# Patient Record
Sex: Female | Born: 1995
Health system: Southern US, Community
[De-identification: ages and names within clinical notes are randomized; demographics above are authoritative.]

## PROBLEM LIST (undated history)

## (undated) ENCOUNTER — Inpatient Hospital Stay: Payer: Self-pay

## (undated) DIAGNOSIS — F419 Anxiety disorder, unspecified: Secondary | ICD-10-CM

## (undated) DIAGNOSIS — R112 Nausea with vomiting, unspecified: Secondary | ICD-10-CM

## (undated) DIAGNOSIS — O1490 Unspecified pre-eclampsia, unspecified trimester: Secondary | ICD-10-CM

## (undated) DIAGNOSIS — Z9889 Other specified postprocedural states: Secondary | ICD-10-CM

## (undated) DIAGNOSIS — F32A Depression, unspecified: Secondary | ICD-10-CM

## (undated) DIAGNOSIS — F4310 Post-traumatic stress disorder, unspecified: Secondary | ICD-10-CM

## (undated) HISTORY — DX: Anxiety disorder, unspecified: F41.9

## (undated) HISTORY — PX: WISDOM TOOTH EXTRACTION: SHX21

## (undated) HISTORY — DX: Post-traumatic stress disorder, unspecified: F43.10

## (undated) HISTORY — PX: CYST EXCISION: SHX5701

## (undated) HISTORY — DX: Depression, unspecified: F32.A

## (undated) HISTORY — DX: Unspecified pre-eclampsia, unspecified trimester: O14.90

---
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
## 2015-03-08 ENCOUNTER — Ambulatory Visit: Payer: Self-pay | Admitting: Psychiatry

## 2015-11-29 LAB — OB RESULTS CONSOLE GC/CHLAMYDIA
Chlamydia: NEGATIVE
Gonorrhea: NEGATIVE

## 2015-12-20 LAB — OB RESULTS CONSOLE ANTIBODY SCREEN: Antibody Screen: NEGATIVE

## 2015-12-20 LAB — OB RESULTS CONSOLE ABO/RH: RH TYPE: POSITIVE

## 2015-12-20 LAB — OB RESULTS CONSOLE HEPATITIS B SURFACE ANTIGEN: Hepatitis B Surface Ag: NEGATIVE

## 2015-12-20 LAB — OB RESULTS CONSOLE RPR: RPR: NONREACTIVE

## 2015-12-20 LAB — OB RESULTS CONSOLE VARICELLA ZOSTER ANTIBODY, IGG: VARICELLA IGG: IMMUNE

## 2015-12-20 LAB — OB RESULTS CONSOLE HIV ANTIBODY (ROUTINE TESTING): HIV: NONREACTIVE

## 2015-12-20 LAB — OB RESULTS CONSOLE RUBELLA ANTIBODY, IGM: RUBELLA: IMMUNE

## 2016-04-19 ENCOUNTER — Inpatient Hospital Stay
Admission: EM | Admit: 2016-04-19 | Discharge: 2016-04-19 | Disposition: A | Discharge status: Home / Self Care | Payer: Medicaid Other | Attending: Obstetrics and Gynecology | Admitting: Obstetrics and Gynecology

## 2016-04-19 DIAGNOSIS — Z3A26 26 weeks gestation of pregnancy: Secondary | ICD-10-CM | POA: Insufficient documentation

## 2016-04-19 DIAGNOSIS — Z880 Allergy status to penicillin: Secondary | ICD-10-CM | POA: Diagnosis not present

## 2016-04-19 DIAGNOSIS — M549 Dorsalgia, unspecified: Secondary | ICD-10-CM | POA: Insufficient documentation

## 2016-04-19 DIAGNOSIS — O26892 Other specified pregnancy related conditions, second trimester: Secondary | ICD-10-CM | POA: Diagnosis present

## 2016-04-19 DIAGNOSIS — O2302 Infections of kidney in pregnancy, second trimester: Secondary | ICD-10-CM | POA: Diagnosis not present

## 2016-04-19 MED ORDER — HYDROCODONE-ACETAMINOPHEN 5-325 MG PO TABS: 1.0000 | ORAL_TABLET | Freq: Four times a day (QID) | ORAL | 0 refills | Status: DC | PRN

## 2016-04-19 NOTE — Final Progress Note (Signed)
Physician Final Progress Note  Patient ID: Shelby KarvonenLauren A Long MRN: [id number] DOB/AGE: June 07, 1996 20 y.o.  Admit date: 04/19/2016 Admitting provider: Conard NovakStephen D Mouhamadou Gittleman, MD Discharge date: 04/19/2016   Admission Diagnoses: back pain, pyelonephritis  Discharge Diagnoses:  Pyelonephritis, back pain  History of Present Illness: The patient is a 20 y.o. female G1P0 at 5839w2d recently diagnosed with pyelonephritis in the clinic and started on bactrim.  She started yesterday.  Notes worsening back pain on the left side.  Sitting up and standing for long periods make it worse.  A heating pad makes it better.  The pain is dull and moderate to severe.  No associated symptoms. Specifically denies fever and chills.  No history of UTI this pregnancy.  Notes +FM, no LOF, no vaginal bleeding. No ctx.   History reviewed. No pertinent past medical history.  History reviewed. No pertinent surgical history.  No current facility-administered medications on file prior to encounter.    No current outpatient prescriptions on file prior to encounter.    Allergies  Allergen Reactions  . Penicillins Swelling    Social History   Social History  . Marital status: Single    Spouse name: N/A  . Number of children: N/A  . Years of education: N/A   Occupational History  . Not on file.   Social History Main Topics  . Smoking status: Never Smoker  . Smokeless tobacco: Never Used  . Alcohol use Not on file  . Drug use: Unknown  . Sexual activity: Not on file   Other Topics Concern  . Not on file   Social History Narrative  . No narrative on file    Physical Exam: BP 109/66 (BP Location: Left Arm)   Pulse 84   Temp 98.8 F (37.1 C) (Oral)   Resp 19   Ht 5\' 7"  (1.702 m)   Wt 181 lb (82.1 kg)   BMI 28.35 kg/m   Gen: NAD CV: RRR Pulm: CTAB Abd: gravid, NT, back with diffuse left back pain, not localized to CVA but in the general vicinity.   Pelvic: deferred Ext: no e/c/t  Consults:  None  Significant Findings/ Diagnostic Studies: None  Procedures: NST Baseline: 140 Variability: moderate Accelerations: present (10 x 10) Decelerations: absent Tocometry: absent ctx   Discharge Condition: stable  Disposition: Final discharge disposition not confirmed  Diet: Regular diet  Discharge Activity: Activity as tolerated     Medication List    TAKE these medications   HYDROcodone-acetaminophen 5-325 MG tablet Commonly known as:  NORCO Take 1 tablet by mouth every 6 (six) hours as needed for moderate pain.   prenatal multivitamin Tabs tablet Take 1 tablet by mouth daily at 12 noon.   sulfamethoxazole-trimethoprim 800-160 MG tablet Commonly known as:  BACTRIM DS,SEPTRA DS Take 1 tablet by mouth 2 (two) times daily.        Total time spent taking care of this patient: 25 minutes  Signed: Conard NovakJackson, Shawnay Bramel D, MD  04/19/2016, 10:23 PM

## 2016-04-19 NOTE — Discharge Summary (Signed)
Patient discharged with instructions on pain medication, labor precautions, abdominal pain, and when to seek medical attention. Patient ambulatory at discharge with steady gait and no complaints. Patient discharged with husband.

## 2016-04-19 NOTE — OB Triage Note (Signed)
Patient came in for observation for lower abdominal and back pain since this week. Patient started taking her antibiotics for current UTI yesterday and the pain is getting worse she states. Patient denies uterine contractions. Patient states she has been feeling the baby move fine. Patient denies leaking of fluid, vaginal bleeding and spotting. Vital signs stable and patient afebrile. FHR baseline 135 moderate variability 15 x 15 accelerations and no decelerations. Significant other at bedside. Will continue to monitor.

## 2016-05-28 ENCOUNTER — Inpatient Hospital Stay
Admission: EM | Admit: 2016-05-28 | Discharge: 2016-05-28 | Disposition: A | Discharge status: Home / Self Care | Payer: Medicaid Other | Attending: Certified Nurse Midwife | Admitting: Certified Nurse Midwife

## 2016-05-28 ENCOUNTER — Encounter: Payer: Self-pay | Admitting: *Deleted

## 2016-05-28 DIAGNOSIS — O133 Gestational [pregnancy-induced] hypertension without significant proteinuria, third trimester: Secondary | ICD-10-CM | POA: Insufficient documentation

## 2016-05-28 DIAGNOSIS — O99113 Other diseases of the blood and blood-forming organs and certain disorders involving the immune mechanism complicating pregnancy, third trimester: Secondary | ICD-10-CM | POA: Diagnosis not present

## 2016-05-28 DIAGNOSIS — Z3A31 31 weeks gestation of pregnancy: Secondary | ICD-10-CM | POA: Diagnosis not present

## 2016-05-28 HISTORY — DX: Nausea with vomiting, unspecified: R11.2

## 2016-05-28 HISTORY — DX: Other specified postprocedural states: Z98.890

## 2016-05-28 LAB — PROTEIN / CREATININE RATIO, URINE
Creatinine, Urine: 142 mg/dL
Protein Creatinine Ratio: 0.08 mg/mg{Cre} (ref 0.00–0.15)
TOTAL PROTEIN, URINE: 11 mg/dL

## 2016-05-28 LAB — COMPREHENSIVE METABOLIC PANEL
ALBUMIN: 3.2 g/dL — AB (ref 3.5–5.0)
ALT: 25 U/L (ref 14–54)
ANION GAP: 5 (ref 5–15)
AST: 28 U/L (ref 15–41)
Alkaline Phosphatase: 90 U/L (ref 38–126)
BUN: 7 mg/dL (ref 6–20)
CHLORIDE: 109 mmol/L (ref 101–111)
CO2: 23 mmol/L (ref 22–32)
Calcium: 8.9 mg/dL (ref 8.9–10.3)
Creatinine, Ser: 0.54 mg/dL (ref 0.44–1.00)
GFR calc Af Amer: >60 mL/min (ref >60)
GFR calc non Af Amer: >60 mL/min (ref >60)
GLUCOSE: 71 mg/dL (ref 65–99)
Potassium: 3.6 mmol/L (ref 3.5–5.1)
SODIUM: 137 mmol/L (ref 135–145)
Total Bilirubin: <0.1 mg/dL — ABNORMAL LOW (ref 0.3–1.2)
Total Protein: 6.3 g/dL — ABNORMAL LOW (ref 6.5–8.1)

## 2016-05-28 LAB — CBC
HEMATOCRIT: 35.2 % (ref 35.0–47.0)
HEMOGLOBIN: 12.1 g/dL (ref 12.0–16.0)
MCH: 32.2 pg (ref 26.0–34.0)
MCHC: 34.3 g/dL (ref 32.0–36.0)
MCV: 93.9 fL (ref 80.0–100.0)
Platelets: 148 10*3/uL — ABNORMAL LOW (ref 150–440)
RBC: 3.75 MIL/uL — ABNORMAL LOW (ref 3.80–5.20)
RDW: 13.6 % (ref 11.5–14.5)
WBC: 8 10*3/uL (ref 3.6–11.0)

## 2016-05-28 MED ORDER — ACETAMINOPHEN 500 MG PO TABS: 1000.0000 mg | ORAL_TABLET | Freq: Four times a day (QID) | ORAL | Status: DC | PRN

## 2016-05-28 NOTE — OB Triage Note (Signed)
Redvd from office visit with c/o weight gain, proteinuria, and elevated b/p.  Changed to gown and to bed.  EFM applied.  Plan of care discussed and oriented to room.

## 2016-05-28 NOTE — Final Progress Note (Signed)
Physician Final Progress Note  Patient ID: Shelby KarvonenLauren A Nininger MRN: [id number] DOB/AGE: 19-Apr-1996 20 y.o.  Admit date: 05/28/2016 Admitting provider: Conard NovakStephen D Jackson, MD Discharge date: 05/28/2016   Admission Diagnoses: elevated blood pressure and proteinuria in third trimester  Discharge Diagnoses:  IUP at 31weeks 6 days Mild thrombocytopenia  No evidence of pre-eclampsia  Consults: None  Significant Findings/ Diagnostic Studies: 20 year old G1P0, EDC 07/24/16, by 9 week ultrasound.  Presents from office for PIH eval.  BP in office 142/88, 148/94, with +2 proteinuria.  Complaints of headaches and increased swelling.  16 pound weight gain in last 3-4 weeks. Had frontal headache on arrival, denies visual changes, SOB, CP, and RUQ.  Baby active.    Temp:  [98.9 F (37.2 C)] 98.9 F (37.2 C) (10/31 1533) Pulse Rate:  [67-85] 67 (10/31 1654) Resp:  [16] 16 (10/31 1533) BP: (124-132)/(70-85) 128/71 (10/31 1654) Weight:  [93 kg (205 lb)] 93 kg (205 lb) (10/31 1533)  Exam:  Neuro: +1/+2 DTR, alert, awake, answers questions appropriately Lungs: CTA BL Heart: Regular rate and rhythm, without murmur Extremities: +1/+2 non-pitting edema bilateral lower extremities, hand edema present Abdomen: Gravid, soft, non-tender, negative CVA tenderness FHR: Baseline 135-140, moderate variability, accelerations to 170's, no decelerations Toco: acontractile  Ultrasound: Cephalic presentation  Results for orders placed or performed during the hospital encounter of 05/28/16 (from the past 24 hour(s))  Protein / creatinine ratio, urine     Status: None   Collection Time: 05/28/16  3:24 PM  Result Value Ref Range   Creatinine, Urine 142 mg/dL   Total Protein, Urine 11 mg/dL   Protein Creatinine Ratio 0.08 0.00 - 0.15 mg/mg[Cre]  Comprehensive metabolic panel     Status: Abnormal   Collection Time: 05/28/16  4:00 PM  Result Value Ref Range   Sodium 137 135 - 145 mmol/L   Potassium 3.6 3.5 - 5.1  mmol/L   Chloride 109 101 - 111 mmol/L   CO2 23 22 - 32 mmol/L   Glucose, Bld 71 65 - 99 mg/dL   BUN 7 6 - 20 mg/dL   Creatinine, Ser 4.090.54 0.44 - 1.00 mg/dL   Calcium 8.9 8.9 - 81.110.3 mg/dL   Total Protein 6.3 (L) 6.5 - 8.1 g/dL   Albumin 3.2 (L) 3.5 - 5.0 g/dL   AST 28 15 - 41 U/L   ALT 25 14 - 54 U/L   Alkaline Phosphatase 90 38 - 126 U/L   Total Bilirubin <0.1 (L) 0.3 - 1.2 mg/dL   GFR calc non Af Amer >60 >60 mL/min   GFR calc Af Amer >60 >60 mL/min   Anion gap 5 5 - 15  CBC     Status: Abnormal   Collection Time: 05/28/16  4:00 PM  Result Value Ref Range   WBC 8.0 3.6 - 11.0 K/uL   RBC 3.75 (L) 3.80 - 5.20 MIL/uL   Hemoglobin 12.1 12.0 - 16.0 g/dL   HCT 91.435.2 78.235.0 - 95.647.0 %   MCV 93.9 80.0 - 100.0 fL   MCH 32.2 26.0 - 34.0 pg   MCHC 34.3 32.0 - 36.0 g/dL   RDW 21.313.6 08.611.5 - 57.814.5 %   Platelets 148 (L) 150 - 440 K/uL   A: IUP at 31 6/7 weeks, mild thrombocytopenia, no evidence of pre-eclampsia P: Consulted with Dr. Jean RosenthalJackson re: plan of management.  Will discharge today with pre-eclampsia precautions.  Follow-up Friday 11/3 at 1000.  Procedures: None  Discharge Condition: stable  Disposition: 01-Home or  Self Care  Diet: Regular diet  Discharge Activity: Activity as tolerated     Medication List    TAKE these medications   nitrofurantoin (macrocrystal-monohydrate) 100 MG capsule Commonly known as:  MACROBID Take 100 mg by mouth at bedtime.   prenatal multivitamin Tabs tablet Take 1 tablet by mouth daily at 12 noon.        Total time spent taking care of this patient: 20 minutes  Signed: Farrel ConnersGUTIERREZ, Carisha Kantor 05/28/2016, 5:46 PM

## 2016-05-28 NOTE — Progress Notes (Signed)
Discharge instructions given and explained.  Verbalized understanding.  Questions answered.  Signed copy on chart.

## 2016-05-30 ENCOUNTER — Other Ambulatory Visit: Payer: Self-pay | Admitting: Obstetrics and Gynecology

## 2016-05-30 ENCOUNTER — Ambulatory Visit (INDEPENDENT_AMBULATORY_CARE_PROVIDER_SITE_OTHER): Payer: 59 | Admitting: Obstetrics and Gynecology

## 2016-05-30 VITALS — BP 119/71 | HR 89 | Wt 206.8 lb

## 2016-05-30 DIAGNOSIS — O1203 Gestational edema, third trimester: Secondary | ICD-10-CM

## 2016-05-30 DIAGNOSIS — F419 Anxiety disorder, unspecified: Secondary | ICD-10-CM

## 2016-05-30 DIAGNOSIS — Z8659 Personal history of other mental and behavioral disorders: Secondary | ICD-10-CM

## 2016-05-30 DIAGNOSIS — Z3403 Encounter for supervision of normal first pregnancy, third trimester: Secondary | ICD-10-CM

## 2016-05-30 DIAGNOSIS — O2301 Infections of kidney in pregnancy, first trimester: Secondary | ICD-10-CM

## 2016-05-30 DIAGNOSIS — O26843 Uterine size-date discrepancy, third trimester: Secondary | ICD-10-CM

## 2016-05-30 LAB — POCT URINALYSIS DIPSTICK
Bilirubin, UA: NEGATIVE
Glucose, UA: NEGATIVE
KETONES UA: NEGATIVE
Leukocytes, UA: NEGATIVE
Nitrite, UA: NEGATIVE
PH UA: 6
RBC UA: NEGATIVE
SPEC GRAV UA: 1.025
UROBILINOGEN UA: NEGATIVE

## 2016-05-30 NOTE — Patient Instructions (Addendum)
Preeclampsia and Eclampsia Preeclampsia is a serious condition that develops only during pregnancy. It is also called toxemia of pregnancy. This condition causes high blood pressure along with other symptoms, such as swelling and headaches. These may develop as the condition gets worse. Preeclampsia may occur 20 weeks or later into your pregnancy.  Diagnosing and treating preeclampsia early is very important. If not treated early, it can cause serious problems for you and your baby. One problem it can lead to is eclampsia, which is a condition that causes muscle jerking or shaking (convulsions) in the mother. Delivering your baby is the best treatment for preeclampsia or eclampsia.  RISK FACTORS The cause of preeclampsia is not known. You may be more likely to develop preeclampsia if you have certain risk factors. These include:   Being pregnant for the first time.  Having preeclampsia in a past pregnancy.  Having a family history of preeclampsia.  Having high blood pressure.  Being pregnant with twins or triplets.  Being 135 or older.  Being African American.  Having kidney disease or diabetes.  Having medical conditions such as lupus or blood diseases.  Being very overweight (obese). SIGNS AND SYMPTOMS  The earliest signs of preeclampsia are:  High blood pressure.  Increased protein in your urine. Your health care provider will check for this at every prenatal visit. Other symptoms that can develop include:   Severe headaches.  Sudden weight gain.  Swelling of your hands, face, legs, and feet.  Feeling sick to your stomach (nauseous) and throwing up (vomiting).  Vision problems (blurred or double vision).  Numbness in your face, arms, legs, and feet.  Dizziness.  Slurred speech.  Sensitivity to bright lights.  Abdominal pain. DIAGNOSIS  There are no screening tests for preeclampsia. Your health care provider will ask you about symptoms and check for signs of  preeclampsia during your prenatal visits. You may also have tests, including:  Urine testing.  Blood testing.  Checking your baby's heart rate.  Checking the health of your baby and your placenta using images created with sound waves (ultrasound). TREATMENT  You can work out the best treatment approach together with your health care provider. It is very important to keep all prenatal appointments. If you have an increased risk of preeclampsia, you may need more frequent prenatal exams.  Your health care provider may prescribe bed rest.  You may have to eat as little salt as possible.  You may need to take medicine to lower your blood pressure if the condition does not respond to more conservative measures.  You may need to stay in the hospital if your condition is severe. There, treatment will focus on controlling your blood pressure and fluid retention. You may also need to take medicine to prevent seizures.  If the condition gets worse, your baby may need to be delivered early to protect you and the baby. You may have your labor started with medicine (be induced), or you may have a cesarean delivery.  Preeclampsia usually goes away after the baby is born. HOME CARE INSTRUCTIONS   Only take over-the-counter or prescription medicines as directed by your health care provider.  Lie on your left side while resting. This keeps pressure off your baby.  Elevate your feet while resting.  Get regular exercise. Ask your health care provider what type of exercise is safe for you.  Avoid caffeine and alcohol.  Do not smoke.  Drink 6-8 glasses of water every day.  Eat a balanced diet  that is low in salt. Do not add salt to your food.  Avoid stressful situations as much as possible.  Get plenty of rest and sleep.  Keep all prenatal appointments and tests as scheduled. SEEK MEDICAL CARE IF:  You are gaining more weight than expected.  You have any headaches, abdominal pain, or  nausea.  You are bruising more than usual.  You feel dizzy or light-headed. SEEK IMMEDIATE MEDICAL CARE IF:   You develop sudden or severe swelling anywhere in your body. This usually happens in the legs.  You gain 5 lb (2.3 kg) or more in a week.  You have a severe headache, dizziness, problems with your vision, or confusion.  You have severe abdominal pain.  You have lasting nausea or vomiting.  You have a seizure.  You have trouble moving any part of your body.  You develop numbness in your body.  You have trouble speaking.  You have any abnormal bleeding.  You develop a stiff neck.  You pass out. MAKE SURE YOU:   Understand these instructions.  Will watch your condition.  Will get help right away if you are not doing well or get worse.   This information is not intended to replace advice given to you by your health care provider. Make sure you discuss any questions you have with your health care provider.   Document Released: 07/12/2000 Document Revised: 07/20/2013 Document Reviewed: 05/07/2013 Elsevier Interactive Patient Education 2016 Elsevier Inc. Edema Edema is an abnormal buildup of fluids in your bodytissues. Edema is somewhatdependent on gravity to pull the fluid to the lowest place in your body. That makes the condition more common in the legs and thighs (lower extremities). Painless swelling of the feet and ankles is common and becomes more likely as you get older. It is also common in looser tissues, like around your eyes.  When the affected area is squeezed, the fluid may move out of that spot and leave a dent for a few moments. This dent is called pitting.  CAUSES  There are many possible causes of edema. Eating too much salt and being on your feet or sitting for a long time can cause edema in your legs and ankles. Hot weather may make edema worse. Common medical causes of edema include:  Heart failure.  Liver disease.  Kidney disease.  Weak  blood vessels in your legs.  Cancer.  An injury.  Pregnancy.  Some medications.  Obesity. SYMPTOMS  Edema is usually painless.Your skin may look swollen or shiny.  DIAGNOSIS  Your health care provider may be able to diagnose edema by asking about your medical history and doing a physical exam. You may need to have tests such as X-rays, an electrocardiogram, or blood tests to check for medical conditions that may cause edema.  TREATMENT  Edema treatment depends on the cause. If you have heart, liver, or kidney disease, you need the treatment appropriate for these conditions. General treatment may include:  Elevation of the affected body part above the level of your heart.  Compression of the affected body part. Pressure from elastic bandages or support stockings squeezes the tissues and forces fluid back into the blood vessels. This keeps fluid from entering the tissues.  Restriction of fluid and salt intake.  Use of a water pill (diuretic). These medications are appropriate only for some types of edema. They pull fluid out of your body and make you urinate more often. This gets rid of fluid and reduces swelling,  but diuretics can have side effects. Only use diuretics as directed by your health care provider. HOME CARE INSTRUCTIONS   Keep the affected body part above the level of your heart when you are lying down.   Do not sit still or stand for prolonged periods.   Do not put anything directly under your knees when lying down.  Do not wear constricting clothing or garters on your upper legs.   Exercise your legs to work the fluid back into your blood vessels. This may help the swelling go down.   Wear elastic bandages or support stockings to reduce ankle swelling as directed by your health care provider.   Eat a low-salt diet to reduce fluid if your health care provider recommends it.   Only take medicines as directed by your health care provider. SEEK MEDICAL CARE  IF:   Your edema is not responding to treatment.  You have heart, liver, or kidney disease and notice symptoms of edema.  You have edema in your legs that does not improve after elevating them.   You have sudden and unexplained weight gain. SEEK IMMEDIATE MEDICAL CARE IF:   You develop shortness of breath or chest pain.   You cannot breathe when you lie down.  You develop pain, redness, or warmth in the swollen areas.   You have heart, liver, or kidney disease and suddenly get edema.  You have a fever and your symptoms suddenly get worse. MAKE SURE YOU:   Understand these instructions.  Will watch your condition.  Will get help right away if you are not doing well or get worse.   This information is not intended to replace advice given to you by your health care provider. Make sure you discuss any questions you have with your health care provider.   Document Released: 07/15/2005 Document Revised: 08/05/2014 Document Reviewed: 05/07/2013 Elsevier Interactive Patient Education [employer]2016 Elsevier Inc.

## 2016-05-30 NOTE — Progress Notes (Signed)
TRANSFER IN OB HISTORY AND PHYSICAL  SUBJECTIVE:       Shelby Long is a 20 y.o. G1P0 female,  Estimated Date of Delivery: 07/24/16, 520w1d, by LMP 10/18/15 (approximate) who presents today for Transition of Prenatal Care.  [facility] as she was unhappy with her care.  EPIC data migration from outside records is accomplished today.  Complaints today include: bilateral lower extremity swelling and rapid weight gain.  Patient notes that by the evening her legs and feet have swollen to 2-3 times their normal size.  Also notes that ~ 2-3 weeks ago, she had a sudden 20 lb weight gain within 1 week.  Patient denied any changes in her diet.    Patient Active Problem List   Diagnosis Date Noted  . Anxiety 01/31/2015  . Depression 01/31/2015  . Leg swelling in pregnancy in third trimester 05/30/2016  . Pyelonephritis affecting pregnancy in first trimester 05/30/2016      Gynecologic History Reports normal menses.  LMP 10/18/2015.  Contraception: none Last Pap: performed 12/2015. Results were: normal  Obstetric History OB History  Gravida Para Term Preterm AB Living  1            SAB TAB Ectopic Multiple Live Births               # Outcome Date GA Lbr Len/2nd Weight Sex Delivery Anes PTL Lv  1 Current               Past Medical History:  Diagnosis Date  . Medical history non-contributory   . PONV (postoperative nausea and vomiting)     Past Surgical History:  Procedure Laterality Date  . WISDOM TOOTH EXTRACTION  2015    No family history on file.   Social History   Social History  . Marital status: Single    Spouse name: N/A  . Number of children: N/A  . Years of education: N/A   Occupational History  . Not on file.   Social History Main Topics  . Smoking status: Never Smoker  . Smokeless tobacco: Never Used  . Alcohol use No  . Drug use: No  . Sexual activity: Yes   Other Topics Concern  . Not on file   Social  History Narrative  . No narrative on file    Current Outpatient Prescriptions on File Prior to Visit  Medication Sig Dispense Refill  . nitrofurantoin, macrocrystal-monohydrate, (MACROBID) 100 MG capsule Take 100 mg by mouth at bedtime.    . Prenatal Vit-Fe Fumarate-FA (PRENATAL MULTIVITAMIN) TABS tablet Take 1 tablet by mouth daily at 12 noon.     No current facility-administered medications on file prior to visit.     Allergies  Allergen Reactions  . Sulfa Antibiotics Anaphylaxis  . Penicillins Swelling    The following portions of the patient's history were reviewed and updated as appropriate: allergies, current medications, past OB history, past medical history, past surgical history, past family history, past social history, and problem list.    OBJECTIVE: Initial Physical Exam (New OB)  GENERAL APPEARANCE: alert, well appearing HEAD: normocephalic, atraumatic MOUTH: mucous membranes moist, pharynx normal without lesions THYROID: no thyromegaly or masses present BREASTS: no masses noted, no significant tenderness, no palpable axillary nodes, no skin changes LUNGS: clear to auscultation, no wheezes, rales or rhonchi, symmetric air entry HEART: regular rate and rhythm, no murmurs ABDOMEN: soft, nontender, nondistended, no abnormal masses, no epigastric pain.  FH 28 cm.  FHT  142 EXTREMITIES: no redness or tenderness in the calves or thighs, bilateral lower extremity swelling, trace pitting edema up to mid-calf.  SKIN: normal coloration and turgor, no rashes LYMPH NODES: no adenopathy palpable NEUROLOGIC: alert, oriented, normal speech, no focal findings or movement disorder noted  PELVIC EXAM deferred  ASSESSMENT: Normal pregnancy at 32.[redacted] weeks gestation Bilateral extremity swelling Rapid weight gain H/o pyelonephritis in early pregnancy H/o anxiety and depression Size less than dates  PLAN: 1. Pregnancy at 32.2 weeks - Prenatal records reviewed - Continue with  routine prenatal care  2. Bilateral extremity swelling  - Discussion had with patient that some swelling in the 3rd trimester is normal.  Concerning for patient is the rapid weight gain. Discussed s/s of pre-eclampsia.  Patient does note that her face was beginning to become swollen today.  Denies headaches, blurred vision, RUQ pain.  BP wnl today (however slightly higher than usual BPs). Will order baseline PIH labs for reassurance.  Discussed elevating feet when resting, more frequent breaks at job as she notes that she stands all day (works in [employer]), and [employer].  If edema becomes more significant, can consider use of Lasix.   3. Rapid weight gain - see above for bilateral extremity swelling  4. H/o pyelonephritis - Patient currently on Macrobid suprresion.  Notes compliance with medications.  5. H/o anxiety/depression - Patient not currently experiencing symptoms, not on any meds.  Will continue to monitor.   6. Size less than dates - Will order growth scan within 1 week to assess growth.   [facility]   Hildred LaserAnika Williemae Muriel, MD [facility]

## 2016-05-31 ENCOUNTER — Telehealth: Payer: Self-pay | Admitting: Obstetrics and Gynecology

## 2016-05-31 LAB — SPECIMEN STATUS REPORT

## 2016-05-31 MED ORDER — FUROSEMIDE 40 MG PO TABS: ORAL_TABLET | ORAL | 3 refills | Status: DC

## 2016-05-31 NOTE — Telephone Encounter (Signed)
Pt was seen yesterday and is [redacted] wks pregnant. Swelling really bad ankles feet hands, her ankles grown 2 in. 3hrs. Also running a fever of 100.5, dull headache

## 2016-05-31 NOTE — Addendum Note (Signed)
Addended by: Frankey ShownGLANTON, Karena Kinker C on: 05/31/2016 03:18 PM   Modules accepted: Orders

## 2016-05-31 NOTE — Telephone Encounter (Signed)
Called pt she states that she is concerned because she has swollen 2 inches since this morning. Pt states that she has been resting with feet elevated. Advised pt that per Dr.Cherry her labs did not show pre-eclampsia and we are likely just dealing with edema. Advised pt on the use of Lasix 40 mg every 3 days as need. Advised pt that she will be voiding a lot, will provider note for work restrictions. Pt also states that she has a low grade fever advised on the use of Tylenol. Pt states that she took her BP at home and the reading was 150/96. Advised pt to continue to check BPs. Will inform provider.

## 2016-06-01 ENCOUNTER — Encounter: Payer: Self-pay | Admitting: Obstetrics and Gynecology

## 2016-06-01 ENCOUNTER — Observation Stay
Admission: EM | Admit: 2016-06-01 | Discharge: 2016-06-01 | Disposition: A | Discharge status: Home / Self Care | Payer: Medicaid Other | Attending: Obstetrics and Gynecology | Admitting: Obstetrics and Gynecology

## 2016-06-01 DIAGNOSIS — Z3A32 32 weeks gestation of pregnancy: Secondary | ICD-10-CM | POA: Insufficient documentation

## 2016-06-01 DIAGNOSIS — O2301 Infections of kidney in pregnancy, first trimester: Secondary | ICD-10-CM

## 2016-06-01 DIAGNOSIS — O26893 Other specified pregnancy related conditions, third trimester: Secondary | ICD-10-CM | POA: Diagnosis present

## 2016-06-01 DIAGNOSIS — M7989 Other specified soft tissue disorders: Secondary | ICD-10-CM | POA: Insufficient documentation

## 2016-06-01 DIAGNOSIS — O1203 Gestational edema, third trimester: Secondary | ICD-10-CM

## 2016-06-01 LAB — CBC
Hematocrit: 33.9 % — ABNORMAL LOW (ref 34.0–46.6)
Hemoglobin: 11.5 g/dL (ref 11.1–15.9)
MCH: 31.6 pg (ref 26.6–33.0)
MCHC: 33.9 g/dL (ref 31.5–35.7)
MCV: 93 fL (ref 79–97)
PLATELETS: 161 10*3/uL (ref 150–379)
RBC: 3.64 x10E6/uL — AB (ref 3.77–5.28)
RDW: 13.8 % (ref 12.3–15.4)
WBC: 6.5 10*3/uL (ref 3.4–10.8)

## 2016-06-01 LAB — COMPREHENSIVE METABOLIC PANEL
ALK PHOS: 94 IU/L (ref 39–117)
ALT: 23 IU/L (ref 0–32)
AST: 22 IU/L (ref 0–40)
Albumin/Globulin Ratio: 1.3 (ref 1.2–2.2)
Albumin: 3.1 g/dL — ABNORMAL LOW (ref 3.5–5.5)
BUN/Creatinine Ratio: 7 — ABNORMAL LOW (ref 9–23)
BUN: 5 mg/dL — ABNORMAL LOW (ref 6–20)
Bilirubin Total: <0.2 mg/dL (ref 0.0–1.2)
CO2: 18 mmol/L (ref 18–29)
CREATININE: 0.67 mg/dL (ref 0.57–1.00)
Calcium: 8.3 mg/dL — ABNORMAL LOW (ref 8.7–10.2)
Chloride: 104 mmol/L (ref 96–106)
GFR calc Af Amer: 145 mL/min/{1.73_m2} (ref >59)
GFR calc non Af Amer: 126 mL/min/{1.73_m2} (ref >59)
GLOBULIN, TOTAL: 2.4 g/dL (ref 1.5–4.5)
Glucose: 80 mg/dL (ref 65–99)
POTASSIUM: 3.6 mmol/L (ref 3.5–5.2)
SODIUM: 141 mmol/L (ref 134–144)
Total Protein: 5.5 g/dL — ABNORMAL LOW (ref 6.0–8.5)

## 2016-06-01 NOTE — Final Progress Note (Signed)
L&D OB Triage Note  HPI:  Shelby Long is a 20 y.o. G1P0 female at 7464w3d. Estimated Date of Delivery: 07/24/16 who presents for complaints of continued bilateral leg swelling.  Patient notes that she took her initial dose of Lasix yesterday, but did not experience much voiding.  Was ruled out for pre-eclampsia in clinic 2 days ago.  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  Was elevating feet in between loads, but swelling still occurred.  Has not yet gotten Ted hose. Denies headaches, blurred vision, RUQ pain, contractions, LOF, vaginal bleeding. Notes good FM.     OB History  Gravida Para Term Preterm AB Living  1            SAB TAB Ectopic Multiple Live Births               # Outcome Date GA Lbr Len/2nd Weight Sex Delivery Anes PTL Lv  1 Current               Patient Active Problem List   Diagnosis Date Noted  . Leg swelling in pregnancy in third trimester 06/01/2016  . UTI in pregnancy, antepartum, first trimester 06/01/2016    Past Medical History:  Diagnosis Date  . Medical history non-contributory   . PONV (postoperative nausea and vomiting)     No current facility-administered medications on file prior to encounter.    Current Outpatient Prescriptions on File Prior to Encounter  Medication Sig Dispense Refill  . furosemide (LASIX) 40 MG tablet Take one tablet by mouth, repeat in 3 days if needed. 30 tablet 3  . nitrofurantoin, macrocrystal-monohydrate, (MACROBID) 100 MG capsule Take 100 mg by mouth at bedtime.    . Prenatal Vit-Fe Fumarate-FA (PRENATAL MULTIVITAMIN) TABS tablet Take 1 tablet by mouth daily at 12 noon.      Allergies  Allergen Reactions  . Sulfa Antibiotics Anaphylaxis  . Penicillins Swelling     ROS:  Review of Systems - Negative except what's noted in HPI.   Physical Exam:  Blood pressure (!)136/89, pulse 62, temperature 98.3 F (36.8 C), temperature source Oral, resp. rate 18, height 5\' 8"  (1.727 m), weight 206 lb  (93.4 kg). General appearance: alert and no distress Abdomen: soft, non-tender; bowel sounds normal; no masses,  no organomegaly. Gravid.  Pelvic: deferred Extremities: extremities normal, atraumatic, no cyanosis or edema   NST INTERPRETATION: Indications: rule out uterine contractions  Mode: External Baseline Rate (A): 140 bpm Variability: Moderate Accelerations: 15 x 15 Decelerations: None     Contraction Frequency (min): 0  Impression: reactive   Assessment:  20 y.o. G1P0 at 4464w3d with:  1.  Bilateral lower extremity swelling.   Plan:  1. Will give patient ted hose while in house.  2. Advised patient that she can take 1 additional dose of Lasix in 2 days if needed.  3. Work letter written for reduced activity, frequent breaks.  Also given work excuse for today.  4. Can d/c home.    Hildred LaserAnika Cloris Flippo, MD [facility]

## 2016-06-01 NOTE — OB Triage Note (Signed)
Ted hose placed on pt. Before dc to home.

## 2016-06-02 DIAGNOSIS — Z8659 Personal history of other mental and behavioral disorders: Secondary | ICD-10-CM | POA: Insufficient documentation

## 2016-06-02 DIAGNOSIS — F419 Anxiety disorder, unspecified: Secondary | ICD-10-CM | POA: Insufficient documentation

## 2016-06-02 LAB — PROTEIN / CREATININE RATIO, URINE
Creatinine, Urine: 309.1 mg/dL
PROTEIN UR: 31.4 mg/dL
PROTEIN/CREAT RATIO: 102 mg/g{creat} (ref 0–200)

## 2016-06-02 LAB — SPECIMEN STATUS REPORT

## 2016-06-03 ENCOUNTER — Encounter: Payer: Self-pay | Admitting: Obstetrics and Gynecology

## 2016-06-04 ENCOUNTER — Observation Stay
Admission: EM | Admit: 2016-06-04 | Discharge: 2016-06-05 | Disposition: A | Discharge status: Home / Self Care | Payer: Medicaid Other | Attending: Obstetrics and Gynecology | Admitting: Obstetrics and Gynecology

## 2016-06-04 ENCOUNTER — Observation Stay: Payer: Medicaid Other

## 2016-06-04 DIAGNOSIS — R109 Unspecified abdominal pain: Secondary | ICD-10-CM | POA: Diagnosis present

## 2016-06-04 DIAGNOSIS — R1010 Upper abdominal pain, unspecified: Secondary | ICD-10-CM | POA: Diagnosis not present

## 2016-06-04 DIAGNOSIS — Z3A32 32 weeks gestation of pregnancy: Secondary | ICD-10-CM | POA: Diagnosis not present

## 2016-06-04 DIAGNOSIS — R030 Elevated blood-pressure reading, without diagnosis of hypertension: Secondary | ICD-10-CM | POA: Diagnosis not present

## 2016-06-04 DIAGNOSIS — R1084 Generalized abdominal pain: Secondary | ICD-10-CM | POA: Diagnosis not present

## 2016-06-04 DIAGNOSIS — O26893 Other specified pregnancy related conditions, third trimester: Principal | ICD-10-CM | POA: Diagnosis present

## 2016-06-04 DIAGNOSIS — Z880 Allergy status to penicillin: Secondary | ICD-10-CM | POA: Insufficient documentation

## 2016-06-04 DIAGNOSIS — O1400 Mild to moderate pre-eclampsia, unspecified trimester: Secondary | ICD-10-CM

## 2016-06-04 DIAGNOSIS — Z3403 Encounter for supervision of normal first pregnancy, third trimester: Secondary | ICD-10-CM | POA: Diagnosis not present

## 2016-06-04 LAB — COMPREHENSIVE METABOLIC PANEL
ALT: 31 U/L (ref 14–54)
AST: 37 U/L (ref 15–41)
Albumin: 3 g/dL — ABNORMAL LOW (ref 3.5–5.0)
Alkaline Phosphatase: 100 U/L (ref 38–126)
Anion gap: 4 — ABNORMAL LOW (ref 5–15)
BUN: 7 mg/dL (ref 6–20)
CHLORIDE: 111 mmol/L (ref 101–111)
CO2: 23 mmol/L (ref 22–32)
CREATININE: 0.7 mg/dL (ref 0.44–1.00)
Calcium: 8.7 mg/dL — ABNORMAL LOW (ref 8.9–10.3)
GFR calc non Af Amer: >60 mL/min (ref >60)
Glucose, Bld: 80 mg/dL (ref 65–99)
POTASSIUM: 4.2 mmol/L (ref 3.5–5.1)
SODIUM: 138 mmol/L (ref 135–145)
Total Bilirubin: 0.4 mg/dL (ref 0.3–1.2)
Total Protein: 6 g/dL — ABNORMAL LOW (ref 6.5–8.1)

## 2016-06-04 LAB — CBC
HEMATOCRIT: 34 % — AB (ref 35.0–47.0)
HEMATOCRIT: 35.2 % (ref 35.0–47.0)
HEMOGLOBIN: 12.2 g/dL (ref 12.0–16.0)
Hemoglobin: 11.7 g/dL — ABNORMAL LOW (ref 12.0–16.0)
MCH: 32.1 pg (ref 26.0–34.0)
MCH: 32.2 pg (ref 26.0–34.0)
MCHC: 34.4 g/dL (ref 32.0–36.0)
MCHC: 34.7 g/dL (ref 32.0–36.0)
MCV: 93.7 fL (ref 80.0–100.0)
MCV: 92.4 fL (ref 80.0–100.0)
PLATELETS: 126 10*3/uL — AB (ref 150–440)
Platelets: 129 10*3/uL — ABNORMAL LOW (ref 150–440)
RBC: 3.63 MIL/uL — AB (ref 3.80–5.20)
RBC: 3.81 MIL/uL (ref 3.80–5.20)
RDW: 13.6 % (ref 11.5–14.5)
RDW: 13.6 % (ref 11.5–14.5)
WBC: 8.2 10*3/uL (ref 3.6–11.0)
WBC: 8.6 10*3/uL (ref 3.6–11.0)

## 2016-06-04 LAB — PROTEIN / CREATININE RATIO, URINE
Creatinine, Urine: 161 mg/dL
PROTEIN CREATININE RATIO: 2.67 mg/mg{creat} — AB (ref 0.00–0.15)
TOTAL PROTEIN, URINE: 430 mg/dL

## 2016-06-04 MED ORDER — PRENATAL MULTIVITAMIN CH
1.0000 | ORAL_TABLET | Freq: Every day | ORAL | Status: DC
  Filled 2016-06-04: qty 1

## 2016-06-04 MED ORDER — HYDROCODONE-ACETAMINOPHEN 5-325 MG PO TABS: 1.0000 | ORAL_TABLET | ORAL | Status: DC | PRN

## 2016-06-04 MED ORDER — ACETAMINOPHEN 325 MG PO TABS
650.0000 mg | ORAL_TABLET | Freq: Four times a day (QID) | ORAL | Status: DC | PRN
  Administered 2016-06-04 – 2016-06-05 (×2): 650 mg via ORAL
  Filled 2016-06-04 (×2): qty 2

## 2016-06-04 MED ORDER — LACTATED RINGERS IV SOLN: INTRAVENOUS | Status: DC

## 2016-06-04 MED ORDER — ZOLPIDEM TARTRATE 5 MG PO TABS: 5.0000 mg | ORAL_TABLET | Freq: Every evening | ORAL | Status: DC | PRN

## 2016-06-04 MED ORDER — CALCIUM CARBONATE ANTACID 500 MG PO CHEW
2.0000 | CHEWABLE_TABLET | ORAL | Status: DC | PRN
  Administered 2016-06-04 – 2016-06-05 (×2): 400 mg via ORAL
  Filled 2016-06-04 (×2): qty 2

## 2016-06-04 MED ORDER — HYDRALAZINE HCL 20 MG/ML IJ SOLN: 10.0000 mg | Freq: Once | INTRAMUSCULAR | Status: DC | PRN

## 2016-06-04 MED ORDER — DOCUSATE SODIUM 100 MG PO CAPS
100.0000 mg | ORAL_CAPSULE | Freq: Every day | ORAL | Status: DC
  Administered 2016-06-05: 100 mg via ORAL
  Filled 2016-06-04: qty 1

## 2016-06-04 MED ORDER — LABETALOL HCL 5 MG/ML IV SOLN: 20.0000 mg | INTRAVENOUS | Status: DC | PRN

## 2016-06-04 MED ORDER — BETAMETHASONE SOD PHOS & ACET 6 (3-3) MG/ML IJ SUSP
12.0000 mg | INTRAMUSCULAR | Status: AC
  Administered 2016-06-04 – 2016-06-05 (×2): 12 mg via INTRAMUSCULAR
  Filled 2016-06-04 (×2): qty 1

## 2016-06-04 NOTE — H&P (Signed)
Obstetric History and Physical  Shelby Long is a 20 y.o. G1P0 with IUP at 10814w6d presenting for complaints of upper abdominal pain.  Patient notes onset of pain since yesterday.  States that it felt like "1 giant cramp" that won't go away.  Pain has slowly progressed over time.  Pain is now noted over entire abdomen.  Denies nausea/vomiting.  Does report some heartburn but has had this during most of her pregnancy. Patient denies contractions, vaginal bleeding, rupture of membranes, with active fetal movement.    Prenatal Course Source of Care: [facility] with onset of care at 32 weeks ([facility] Pregnancy complications or risks: Patient Active Problem List   Diagnosis Date Noted  . Abdominal pain in pregnancy, third trimester 06/04/2016  . Anxiety 06/02/2016  . History of depression 06/02/2016  . Leg swelling in pregnancy in third trimester 06/01/2016  . Pyelonephritis affecting pregnancy in first trimester 06/01/2016    Prenatal labs and studies: ABO, Rh: A/Positive/-- (05/24 0000) Antibody: Negative (05/24 0000) Rubella: Immune (05/24 0000) RPR: Nonreactive (05/24 0000)  HBsAg: Negative (05/24 0000)  HIV: Non-reactive (05/24 0000)  GBS: Unknown 1 hr Glucola  normal Genetic screening normal Anatomy US normal   Past Medical History:  Diagnosis Date  . Medical history non-contributory   . PONV (postoperative nausea and vomiting)     Past Surgical History:  Procedure Laterality Date  . WISDOM TOOTH EXTRACTION  2015    OB History  Gravida Para Term Preterm AB Living  1            SAB TAB Ectopic Multiple Live Births               # Outcome Date GA Lbr Len/2nd Weight Sex Delivery Anes PTL Lv  1 Current               Social History   Social History  . Marital status: Single    Spouse name: N/A  . Number of children: N/A  . Years of education: N/A   Social History Main Topics  . Smoking status: Never Smoker  . Smokeless  tobacco: Never Used  . Alcohol use No  . Drug use: No  . Sexual activity: Yes   Other Topics Concern  . None   Social History Narrative  . None    History reviewed. No pertinent family history.  Prescriptions Prior to Admission  Medication Sig Dispense Refill Last Dose  . furosemide (LASIX) 40 MG tablet Take one tablet by mouth, repeat in 3 days if needed. 30 tablet 3 Past Week at Unknown time  . nitrofurantoin, macrocrystal-monohydrate, (MACROBID) 100 MG capsule Take 100 mg by mouth at bedtime.   06/03/2016 at Unknown time  . Prenatal Vit-Fe Fumarate-FA (PRENATAL MULTIVITAMIN) TABS tablet Take 1 tablet by mouth daily at 12 noon.   06/03/2016 at Unknown time    Allergies  Allergen Reactions  . Sulfa Antibiotics Anaphylaxis  . Penicillins Swelling    Review of Systems: Negative except for what is mentioned in HPI.  Physical Exam: Vitals:   06/04/16 1641 06/04/16 1656 06/04/16 1711 06/04/16 1802  BP: (!) 148/93 (!) 154/92 (!) 150/96 (!) 143/91  Pulse: 73 81 72 63  Resp:      Temp:      TempSrc:       CONSTITUTIONAL: Well-developed, well-nourished female in no acute distress.  HENT:  Normocephalic, atraumatic, External right and left ear normal. Oropharynx is clear and moist EYES: Conjunctivae  and EOM are normal. Pupils are equal, round, and reactive to light. No scleral icterus.  NECK: Normal range of motion, supple, no masses SKIN: Skin is warm and dry. No rash noted. Not diaphoretic. No erythema. No pallor. NEUROLOGIC: Alert and oriented to person, place, and time. Normal reflexes, muscle tone coordination. No cranial nerve deficit noted. PSYCHIATRIC: Normal mood and affect. Normal behavior. Normal judgment and thought content. CARDIOVASCULAR: Normal heart rate noted, regular rhythm RESPIRATORY: Effort and breath sounds normal, no problems with respiration noted ABDOMEN: Soft, nontender, nondistended, gravid. MUSCULOSKELETAL: Normal range of motion. No edema and no  tenderness. 2+ distal pulses.  Bilateral swelling of feet up to calves.   Cervical Exam: deferred Presentation: unknown FHT:  Baseline rate 150 bpm   Variability moderate  Accelerations present   Decelerations none Contractions: None   Pertinent Labs/Studies:   Results for orders placed or performed during the hospital encounter of 06/04/16 (from the past 24 hour(s))  Protein / creatinine ratio, urine     Status: Abnormal   Collection Time: 06/04/16  3:03 PM  Result Value Ref Range   Creatinine, Urine 161 mg/dL   Total Protein, Urine 430 mg/dL   Protein Creatinine Ratio 2.67 (H) 0.00 - 0.15 mg/mg[Cre]  CBC     Status: Abnormal   Collection Time: 06/04/16  3:18 PM  Result Value Ref Range   WBC 8.2 3.6 - 11.0 K/uL   RBC 3.63 (L) 3.80 - 5.20 MIL/uL   Hemoglobin 11.7 (L) 12.0 - 16.0 g/dL   HCT 16.1 (L) 09.6 - 04.5 %   MCV 93.7 80.0 - 100.0 fL   MCH 32.2 26.0 - 34.0 pg   MCHC 34.4 32.0 - 36.0 g/dL   RDW 40.9 81.1 - 91.4 %   Platelets 126 (L) 150 - 440 K/uL  Comprehensive metabolic panel     Status: Abnormal   Collection Time: 06/04/16  3:18 PM  Result Value Ref Range   Sodium 138 135 - 145 mmol/L   Potassium 4.2 3.5 - 5.1 mmol/L   Chloride 111 101 - 111 mmol/L   CO2 23 22 - 32 mmol/L   Glucose, Bld 80 65 - 99 mg/dL   BUN 7 6 - 20 mg/dL   Creatinine, Ser 7.82 0.44 - 1.00 mg/dL   Calcium 8.7 (L) 8.9 - 10.3 mg/dL   Total Protein 6.0 (L) 6.5 - 8.1 g/dL   Albumin 3.0 (L) 3.5 - 5.0 g/dL   AST 37 15 - 41 U/L   ALT 31 14 - 54 U/L   Alkaline Phosphatase 100 38 - 126 U/L   Total Bilirubin 0.4 0.3 - 1.2 mg/dL   GFR calc non Af Amer >60 >60 mL/min   GFR calc Af Amer >60 >60 mL/min   Anion gap 4 (L) 5 - 15    Assessment : Shelby Long is a 20 y.o. G1P0 at [redacted]w[redacted]d being admitted for observation.  Patient now with newly diagnosed mild pre-eclampsia based on today's labs, attempting to monitor for progression to severe pre-eclampsia or HELLP syndrome (patien twith slightly lower  platelet count and slightly higher LFTs, although all labs still wnl except PC ration which has gone up from 102 to 2670 in 5 days). .    Plan: 1. Admitted to observation with continuous fetal monitoring.  2. Will continue to monitor BPs.  If reaching severe range, patient understands that with severe pre-eclampsia she will need to be delivered. Will also order serial PIH labs to follow trends of  liver and kidney function. Labetalol ordered per protocol for severe range BPs.  3 Will initiate a course of antenatal steroids in case of earlier delivery. First dose today.  4. Ted hose for leg swelling 5. Ultrasound ordered to assess growth and AFI.  Of note, patient was noted to be size<dates at last visit last week.  6. Will inform NICU, will consult if delivery is more imminent.  7. Patient given ES Tylenol for management of her abdominal pain while in triage.  Notes that it did not help.  Discussed alternative medications.  Will prescribe Vicodin.  8. Strict I/O    Hildred LaserAnika Giovan Pinsky, MD [facility] 06/04/2016 8:57 PM

## 2016-06-04 NOTE — OB Triage Note (Signed)
Pt presents c/o abdominal pain starting yesterday morning. States pain starts from her bra line and radiates down. States "Its a constant dull pain that doesn't go away." Denies vaginal bleeding, or LOF. Last sexual encounter was a couple weeks ago.

## 2016-06-04 NOTE — Progress Notes (Signed)
Pt back on unit from ultrasound, monitors replaced, will continue to assess

## 2016-06-05 ENCOUNTER — Observation Stay
Admission: EM | Admit: 2016-06-05 | Discharge: 2016-06-05 | Disposition: A | Discharge status: Home / Self Care | Payer: Medicaid Other | Source: Home / Self Care | Admitting: Obstetrics and Gynecology

## 2016-06-05 DIAGNOSIS — O26893 Other specified pregnancy related conditions, third trimester: Secondary | ICD-10-CM

## 2016-06-05 DIAGNOSIS — R030 Elevated blood-pressure reading, without diagnosis of hypertension: Secondary | ICD-10-CM | POA: Diagnosis not present

## 2016-06-05 DIAGNOSIS — R109 Unspecified abdominal pain: Secondary | ICD-10-CM | POA: Insufficient documentation

## 2016-06-05 DIAGNOSIS — Z3A33 33 weeks gestation of pregnancy: Secondary | ICD-10-CM

## 2016-06-05 DIAGNOSIS — Z3403 Encounter for supervision of normal first pregnancy, third trimester: Secondary | ICD-10-CM | POA: Diagnosis not present

## 2016-06-05 DIAGNOSIS — R1084 Generalized abdominal pain: Secondary | ICD-10-CM | POA: Diagnosis not present

## 2016-06-05 LAB — CBC
HEMATOCRIT: 39.2 % (ref 35.0–47.0)
Hemoglobin: 13.5 g/dL (ref 12.0–16.0)
MCH: 32.2 pg (ref 26.0–34.0)
MCHC: 34.4 g/dL (ref 32.0–36.0)
MCV: 93.6 fL (ref 80.0–100.0)
PLATELETS: 145 10*3/uL — AB (ref 150–440)
RBC: 4.19 MIL/uL (ref 3.80–5.20)
RDW: 13.8 % (ref 11.5–14.5)
WBC: 10.8 10*3/uL (ref 3.6–11.0)

## 2016-06-05 LAB — COMPREHENSIVE METABOLIC PANEL
ALBUMIN: 2.8 g/dL — AB (ref 3.5–5.0)
ALBUMIN: 3.1 g/dL — AB (ref 3.5–5.0)
ALT: 29 U/L (ref 14–54)
ALT: 29 U/L (ref 14–54)
ANION GAP: 6 (ref 5–15)
AST: 31 U/L (ref 15–41)
AST: 33 U/L (ref 15–41)
Alkaline Phosphatase: 108 U/L (ref 38–126)
Alkaline Phosphatase: 97 U/L (ref 38–126)
Anion gap: 9 (ref 5–15)
BILIRUBIN TOTAL: 0.2 mg/dL — AB (ref 0.3–1.2)
BUN: 9 mg/dL (ref 6–20)
BUN: 9 mg/dL (ref 6–20)
CHLORIDE: 109 mmol/L (ref 101–111)
CO2: 20 mmol/L — AB (ref 22–32)
CO2: 20 mmol/L — ABNORMAL LOW (ref 22–32)
CREATININE: 0.75 mg/dL (ref 0.44–1.00)
Calcium: 8.6 mg/dL — ABNORMAL LOW (ref 8.9–10.3)
Calcium: 8.9 mg/dL (ref 8.9–10.3)
Chloride: 111 mmol/L (ref 101–111)
Creatinine, Ser: 0.74 mg/dL (ref 0.44–1.00)
GFR calc Af Amer: >60 mL/min (ref >60)
GFR calc non Af Amer: >60 mL/min (ref >60)
GLUCOSE: 129 mg/dL — AB (ref 65–99)
Glucose, Bld: 92 mg/dL (ref 65–99)
POTASSIUM: 4 mmol/L (ref 3.5–5.1)
Potassium: 4.3 mmol/L (ref 3.5–5.1)
SODIUM: 138 mmol/L (ref 135–145)
Sodium: 137 mmol/L (ref 135–145)
TOTAL PROTEIN: 5.7 g/dL — AB (ref 6.5–8.1)
Total Bilirubin: 0.3 mg/dL (ref 0.3–1.2)
Total Protein: 6.4 g/dL — ABNORMAL LOW (ref 6.5–8.1)

## 2016-06-05 LAB — TYPE AND SCREEN
ABO/RH(D): A POS
Antibody Screen: NEGATIVE

## 2016-06-05 MED ORDER — ACETAMINOPHEN 325 MG PO TABS
ORAL_TABLET | ORAL | Status: AC
  Filled 2016-06-05: qty 2

## 2016-06-05 MED ORDER — ACETAMINOPHEN 325 MG PO TABS
650.0000 mg | ORAL_TABLET | Freq: Four times a day (QID) | ORAL | Status: DC | PRN
  Administered 2016-06-05: 650 mg via ORAL

## 2016-06-05 NOTE — Discharge Instructions (Signed)

## 2016-06-05 NOTE — Progress Notes (Signed)
Antenatal Progress Note  Subjective:     Patient ID: Shelby Long is a 20 y.o. female [redacted]w[redacted]d Estimated Date of Delivery: 07/24/16 who was admitted for pre-eclampsia evaluation.  HD# 2.   Subjective:  Patient denies complaints today.  Tolerating diet.  Notes abdominal pain has improved.   Review of Systems Denies contractions, leakage of fluids, vaginal bleeding, and reports good fetal movement. Denies headache, RUQ pain, blurred vision.     Objective:   Vitals:   06/05/16 0652 06/05/16 0734 06/05/16 0827 06/05/16 0834  BP: 129/75 (!) 144/82 (!) 146/89 (!) 145/84  Pulse: 77 73 83 99  Resp:  16    Temp:  98.4 F (36.9 C)    TempSrc:  Oral      General appearance: alert and no distress Lungs: clear to auscultation bilaterally Heart: regular rate and rhythm, S1, S2 normal, no murmur, click, rub or gallop Abdomen: soft, non-tender; bowel sounds normal; no masses,  no organomegaly Pelvic: deferred Extremities: extremities normal, atraumatic, no cyanosis or edema.  Moderate bilateral leg swelling present.    FHT: baseline 135 bpm, accels present, decels absent.  Variability: moderate Toco: no contractions   Labs:  Results for NLANELLE, LINDO(MRN [id number] as of 06/05/2016 12:18  Ref. Range 06/05/2016 07:42  Antibody Screen Unknown NEG  ABO/RH(D) Unknown A POS    Results for NNEVIN, KOZUCH(MRN [id number] as of 06/05/2016 12:18  Ref. Range 06/04/2016 15:18 06/04/2016 23:35 06/05/2016 07:42  Sodium Latest Ref Range: 135 - 145 mmol/L 138 137 138  Potassium Latest Ref Range: 3.5 - 5.1 mmol/L 4.2 4.0 4.3  Chloride Latest Ref Range: 101 - 111 mmol/L 111 111 109  CO2 Latest Ref Range: 22 - 32 mmol/L 23 20 (L) 20 (L)  BUN Latest Ref Range: 6 - 20 mg/dL _0 Creatinine Latest Ref Range: 0.44 - 1.00 mg/dL 0.70 0.74 0.75  Calcium Latest Ref Range: 8.9 - 10.3 mg/dL 8.7 (L) 8.6 (L) 8.9  EGFR (Non-African Amer.) Latest Ref Range: >60 mL/min >60 >60 >60  EGFR (African  American) Latest Ref Range: >60 mL/min >60 >60 >60  Glucose Latest Ref Range: 65 - 99 mg/dL 80 129 (H) 92  Anion gap Latest Ref Range: 5 - 15  4 (L) 6 9  Alkaline Phosphatase Latest Ref Range: 38 - 126 U/L 100 97 108  Albumin Latest Ref Range: 3.5 - 5.0 g/dL 3.0 (L) 2.8 (L) 3.1 (L)  AST Latest Ref Range: 15 - 41 U/L 37 33 31  ALT Latest Ref Range: 14 - 54 U/L _1 Total Protein Latest Ref Range: 6.5 - 8.1 g/dL 6.0 (L) 5.7 (L) 6.4 (L)  Total Bilirubin Latest Ref Range: 0.3 - 1.2 mg/dL 0.4 0.2 (L) 0.3  WBC Latest Ref Range: 3.6 - 11.0 K/uL 8.2 8.6 10.8  RBC Latest Ref Range: 3.80 - 5.20 MIL/uL 3.63 (L) 3.81 4.19  Hemoglobin Latest Ref Range: 12.0 - 16.0 g/dL 11.7 (L) 12.2 13.5  HCT Latest Ref Range: 35.0 - 47.0 % 34.0 (L) 35.2 39.2  MCV Latest Ref Range: 80.0 - 100.0 fL 93.7 92.4 93.6  MCH Latest Ref Range: 26.0 - 34.0 pg 32.2 32.1 32.2  MCHC Latest Ref Range: 32.0 - 36.0 g/dL 34.4 34.7 34.4  RDW Latest Ref Range: 11.5 - 14.5 % 13.6 13.6 13.8  Platelets Latest Ref Range: 150 - 440 K/uL 126 (L) 129 (L) 145 (L)     Results for NTERRIA, DESCHEPPER(MRN [id number]  as of 06/05/2016 12:18  Ref. Range 06/04/2016 15:03  Total Protein, Urine Latest Units: mg/dL 430  Protein Creatinine Ratio Latest Ref Range: 0.00 - 0.15 mg/mgCre 2.67 (H)  Creatinine, Urine Latest Units: mg/dL 161      Ultrasound 06/04/2016:  EXAM: OBSTETRICAL ULTRASOUND >14 WKS  FINDINGS: Number of Fetuses: 1  Heart Rate:  143 bpm  Movement: Present  Presentation: Cephalic  Previa: No  Placental Location: Anterior fundal  Amniotic Fluid (Subjective): Normal  Amniotic Fluid (Objective):  AFI 11.5 cm (5%ile= 8.6 cm, 95%= 24.2 cm for 32 wks)  FETAL BIOMETRY  BPD:  8.1cm 32w 3d  HC:    29.7cm  32w   6d  AC:   28.0cm  32w   0d  FL:   6.3cm  32w   4d  Current Mean GA: 32w 3d              [place] EDC: 07/27/2016  Estimated Fetal Weight:  1,942g    24%ile  FETAL ANATOMY  Lateral  Ventricles: Visualized  Thalami/CSP: Visualized  Posterior Fossa:  Visualized  Upper Lip: Visualized  Spine: Visualized  4 Chamber Heart on Left: Visualized  LVOT: Not visualized  RVOT: Not visualized  Stomach on Left: Visualized  3 Vessel Cord: Visualized  Cord Insertion site: Visualized  Kidneys: Visualized  Bladder: Visualized  Extremities: Visualized  Technically difficult due to: Study was limited due to gestational age.  Maternal Findings:  Cervix:  4.0 cm and closed.  IMPRESSION: Single viable intrauterine pregnancy at 32 weeks 3 days.   Assessment:  20 y.o. female [redacted]w[redacted]d Estimated Date of Delivery: 07/24/16 with:    1. Mild pre-eclampsia   2. Bilateral leg swelling      Plan:   - Patient ruled out for severe pre-eclampsia/HELLP syndrome.  Labs stable (no change in liver enzymes, platelets now increasing).  Currently with mild pre-eclampsia.  - Can d/c home today, and continue to monitor outpatient with serial labs, NSTs, BP checks.  Will start on Friday.  Given pre-eclampsia precautions.  - To d/c home after 2nd dose of antenatal steroids.    ARubie Maid MD [facility]

## 2016-06-05 NOTE — OB Triage Note (Signed)
Pt arrived to unit complaining of HA and elevated bp(140/100) taken whilst visiting her grandfather who had just been admitted to Hospice. +FM, Denies Leaking fluid, bleeding, or discharge.

## 2016-06-07 ENCOUNTER — Inpatient Hospital Stay
Admission: EM | Admit: 2016-06-07 | Discharge: 2016-06-07 | Disposition: A | Discharge status: Home / Self Care | Payer: Medicaid Other | Source: Home / Self Care | Admitting: Obstetrics and Gynecology

## 2016-06-07 ENCOUNTER — Encounter: Payer: Self-pay | Admitting: Emergency Medicine

## 2016-06-07 ENCOUNTER — Emergency Department: Payer: Medicaid Other

## 2016-06-07 ENCOUNTER — Inpatient Hospital Stay
Admission: EM | Admit: 2016-06-07 | Discharge: 2016-06-09 | DRG: 781 | Disposition: A | Discharge status: Other Acute Inpatient Hospital | Payer: Medicaid Other | Attending: Obstetrics and Gynecology | Admitting: Obstetrics and Gynecology

## 2016-06-07 ENCOUNTER — Ambulatory Visit (INDEPENDENT_AMBULATORY_CARE_PROVIDER_SITE_OTHER): Payer: 59 | Admitting: Obstetrics and Gynecology

## 2016-06-07 ENCOUNTER — Other Ambulatory Visit: Payer: Self-pay

## 2016-06-07 VITALS — BP 152/100 | HR 56 | Wt 220.0 lb

## 2016-06-07 DIAGNOSIS — F419 Anxiety disorder, unspecified: Secondary | ICD-10-CM | POA: Diagnosis present

## 2016-06-07 DIAGNOSIS — O1493 Unspecified pre-eclampsia, third trimester: Secondary | ICD-10-CM | POA: Diagnosis not present

## 2016-06-07 DIAGNOSIS — O99343 Other mental disorders complicating pregnancy, third trimester: Secondary | ICD-10-CM | POA: Diagnosis present

## 2016-06-07 DIAGNOSIS — Z3A33 33 weeks gestation of pregnancy: Secondary | ICD-10-CM | POA: Diagnosis not present

## 2016-06-07 DIAGNOSIS — F329 Major depressive disorder, single episode, unspecified: Secondary | ICD-10-CM | POA: Diagnosis present

## 2016-06-07 DIAGNOSIS — R0602 Shortness of breath: Secondary | ICD-10-CM

## 2016-06-07 DIAGNOSIS — R600 Localized edema: Secondary | ICD-10-CM

## 2016-06-07 DIAGNOSIS — O1490 Unspecified pre-eclampsia, unspecified trimester: Secondary | ICD-10-CM

## 2016-06-07 DIAGNOSIS — Z79899 Other long term (current) drug therapy: Secondary | ICD-10-CM

## 2016-06-07 DIAGNOSIS — O0993 Supervision of high risk pregnancy, unspecified, third trimester: Secondary | ICD-10-CM

## 2016-06-07 DIAGNOSIS — Z880 Allergy status to penicillin: Secondary | ICD-10-CM | POA: Diagnosis not present

## 2016-06-07 DIAGNOSIS — O1423 HELLP syndrome (HELLP), third trimester: Secondary | ICD-10-CM | POA: Diagnosis present

## 2016-06-07 DIAGNOSIS — R079 Chest pain, unspecified: Secondary | ICD-10-CM

## 2016-06-07 DIAGNOSIS — O163 Unspecified maternal hypertension, third trimester: Secondary | ICD-10-CM | POA: Diagnosis present

## 2016-06-07 LAB — POCT URINALYSIS DIPSTICK
Bilirubin, UA: NEGATIVE
Glucose, UA: NEGATIVE
KETONES UA: NEGATIVE
LEUKOCYTES UA: NEGATIVE
Nitrite, UA: NEGATIVE
PH UA: 6
SPEC GRAV UA: 1.02
UROBILINOGEN UA: NEGATIVE

## 2016-06-07 LAB — HEPATIC FUNCTION PANEL
ALT: 29 U/L (ref 14–54)
AST: 36 U/L (ref 15–41)
Albumin: 3.1 g/dL — ABNORMAL LOW (ref 3.5–5.0)
Alkaline Phosphatase: 84 U/L (ref 38–126)
BILIRUBIN TOTAL: 0.4 mg/dL (ref 0.3–1.2)
Total Protein: 5.9 g/dL — ABNORMAL LOW (ref 6.5–8.1)

## 2016-06-07 LAB — BASIC METABOLIC PANEL
ANION GAP: 7 (ref 5–15)
BUN: 21 mg/dL — AB (ref 6–20)
CALCIUM: 9.1 mg/dL (ref 8.9–10.3)
CO2: 20 mmol/L — ABNORMAL LOW (ref 22–32)
Chloride: 109 mmol/L (ref 101–111)
Creatinine, Ser: 0.79 mg/dL (ref 0.44–1.00)
GFR calc Af Amer: >60 mL/min (ref >60)
GLUCOSE: 79 mg/dL (ref 65–99)
POTASSIUM: 3.6 mmol/L (ref 3.5–5.1)
SODIUM: 136 mmol/L (ref 135–145)

## 2016-06-07 LAB — CBC
HEMATOCRIT: 34.4 % — AB (ref 35.0–47.0)
Hemoglobin: 11.8 g/dL — ABNORMAL LOW (ref 12.0–16.0)
MCH: 31.8 pg (ref 26.0–34.0)
MCHC: 34.2 g/dL (ref 32.0–36.0)
MCV: 92.9 fL (ref 80.0–100.0)
Platelets: 131 10*3/uL — ABNORMAL LOW (ref 150–440)
RBC: 3.71 MIL/uL — ABNORMAL LOW (ref 3.80–5.20)
RDW: 13.8 % (ref 11.5–14.5)
WBC: 11.9 10*3/uL — AB (ref 3.6–11.0)

## 2016-06-07 LAB — URINALYSIS COMPLETE WITH MICROSCOPIC (ARMC ONLY)
BILIRUBIN URINE: NEGATIVE
Glucose, UA: NEGATIVE mg/dL
KETONES UR: NEGATIVE mg/dL
LEUKOCYTES UA: NEGATIVE
Nitrite: NEGATIVE
PH: 6 (ref 5.0–8.0)
SPECIFIC GRAVITY, URINE: 1.02 (ref 1.005–1.030)

## 2016-06-07 LAB — BRAIN NATRIURETIC PEPTIDE: B Natriuretic Peptide: 581 pg/mL — ABNORMAL HIGH (ref 0.0–100.0)

## 2016-06-07 LAB — PROTIME-INR
INR: 0.89
Prothrombin Time: 12 seconds (ref 11.4–15.2)

## 2016-06-07 LAB — PROTEIN, URINE, RANDOM: TOTAL PROTEIN, URINE: 3685 mg/dL

## 2016-06-07 LAB — TROPONIN I

## 2016-06-07 MED ORDER — HYDRALAZINE HCL 20 MG/ML IJ SOLN: 10.0000 mg | Freq: Once | INTRAMUSCULAR | Status: DC | PRN

## 2016-06-07 MED ORDER — PRENATAL MULTIVITAMIN CH
1.0000 | ORAL_TABLET | Freq: Every day | ORAL | Status: DC
  Filled 2016-06-07 (×3): qty 1

## 2016-06-07 MED ORDER — ZOLPIDEM TARTRATE 5 MG PO TABS: 5.0000 mg | ORAL_TABLET | Freq: Every evening | ORAL | Status: DC | PRN

## 2016-06-07 MED ORDER — SODIUM CHLORIDE 0.9 % IV SOLN: 250.0000 mL | INTRAVENOUS | Status: DC | PRN

## 2016-06-07 MED ORDER — MAGNESIUM SULFATE BOLUS VIA INFUSION
4.0000 g | Freq: Once | INTRAVENOUS | Status: AC
  Administered 2016-06-08: 4 g via INTRAVENOUS
  Filled 2016-06-07: qty 500

## 2016-06-07 MED ORDER — SODIUM CHLORIDE 0.9% FLUSH: 3.0000 mL | Freq: Two times a day (BID) | INTRAVENOUS | Status: DC

## 2016-06-07 MED ORDER — DOCUSATE SODIUM 100 MG PO CAPS: 100.0000 mg | ORAL_CAPSULE | Freq: Every day | ORAL | Status: DC

## 2016-06-07 MED ORDER — ACETAMINOPHEN 325 MG PO TABS
650.0000 mg | ORAL_TABLET | ORAL | Status: DC | PRN
  Administered 2016-06-07 – 2016-06-09 (×2): 650 mg via ORAL
  Filled 2016-06-07 (×2): qty 2

## 2016-06-07 MED ORDER — CALCIUM CARBONATE ANTACID 500 MG PO CHEW
2.0000 | CHEWABLE_TABLET | ORAL | Status: DC | PRN
  Administered 2016-06-07: 400 mg via ORAL
  Filled 2016-06-07: qty 2

## 2016-06-07 MED ORDER — SODIUM CHLORIDE 0.9% FLUSH: 3.0000 mL | INTRAVENOUS | Status: DC | PRN

## 2016-06-07 MED ORDER — MAGNESIUM SULFATE 50 % IJ SOLN
2.0000 g/h | INTRAVENOUS | Status: DC
  Administered 2016-06-08 – 2016-06-09 (×2): 2 g/h via INTRAVENOUS
  Filled 2016-06-07 (×2): qty 80

## 2016-06-07 MED ORDER — SODIUM CHLORIDE FLUSH 0.9 % IV SOLN
INTRAVENOUS | Status: AC
  Filled 2016-06-07: qty 10

## 2016-06-07 MED ORDER — LABETALOL HCL 5 MG/ML IV SOLN
20.0000 mg | Freq: Once | INTRAVENOUS | Status: AC
  Administered 2016-06-07: 20 mg via INTRAVENOUS
  Filled 2016-06-07: qty 4

## 2016-06-07 MED ORDER — LABETALOL HCL 5 MG/ML IV SOLN: 20.0000 mg | INTRAVENOUS | Status: DC | PRN

## 2016-06-07 NOTE — Progress Notes (Signed)
Pt. With c/o of headache in the temporal area, "I think it may be coming from not eating."  Also compliant of right epigastric pain "that has been going on for a couple of weeks". Pt. States, "It started a couple of weeks ago when I was told about right epigastric pain in the preeclamptic patient." Pt. Wants to eat a regular diet to see if her headache goes away. (Hungry Headache). VO per Dr. Greggory Keenefrancesco - pt. May have regular diet.  FOB went out to get patient a meal - per patient request. Pt. decline tylenol at this time. Nurse will continue to monitor.

## 2016-06-07 NOTE — OB Triage Note (Signed)
Pt. Here from ER for SOB evaluation.  Pt. Connected to EFM and toco.  FHR - 130s. Dr. Greggory Keenefrancesco called - mailbox full.  RN will continue to reach out.

## 2016-06-07 NOTE — Final Progress Note (Signed)
L&D OB Triage Note  Shelby Long is a 20 y.o. G1P0 female at 2333w0d, EDD Estimated Date of Delivery: 07/24/16 who presented to triage for complaints of headache.  Of note, patient was discharged earlier today for workup of pre-eclampsia.  Was diagnosed with mild pre-eclampsia.  In addition, patient reports that after leaving the hospital, she went to visit her grandfather who is in hospice care.  Notes that she "cried a lot", and shortly after got a headache.  Has not tried anything for her headache.   She was evaluated by the nurses with no significant findings. Vital signs stable (labile BPs noted, bu none in severe range). An NST was performed and has been reviewed by MD. She was treated with Tylenol ES.     Physical Exam: Vitals:   06/05/16 2201 06/05/16 2204 06/05/16 2216 06/05/16 2231  BP: (!) 143/83  (!) 140/92 140/84  Pulse: 79  81 78  Resp:  18    Temp:  99 F (37.2 C)    TempSrc:  Oral    Weight:  206 lb (93.4 kg)    Height:  5\' 8"  (1.727 m)      NST INTERPRETATION: Indications: rule out uterine contractions  Mode: External Baseline Rate (A): 140 bpm Variability: Moderate Accelerations: 15 x 15 Decelerations: None     Contraction Frequency (min): none  Impression: reactive   Plan: NST performed was reviewed and was found to be reactive. Headache resolved with Tylenol. She was discharged home with bleeding/labor precautions. Also reiterated pre-eclampsia precautions.  Continue routine prenatal care. Follow up with OB/GYN as previously scheduled.     Hildred LaserAnika Yashvi Jasinski, MD [facility]

## 2016-06-07 NOTE — Progress Notes (Signed)
Pt. Discharged out of system so ER can initiate new chart. Pt. Currently in ER for evaluation.

## 2016-06-07 NOTE — Progress Notes (Signed)
1600:Verbal order received, urine dip for protein.  Clean catch sent to lab.

## 2016-06-07 NOTE — Progress Notes (Addendum)
Pt. Present with c/o of SOB, decrease fetal movement, and increased BP - sent over from doctor's office.  Called MD office to confirm - pt. Complaining of SOB - pt. was advised by nurse in doctor's office that she would be seen in ER for SOB before coming to L&D; pt. Transferred to ER via WC for SOB and will be brought to L&D to be triaged for increased BP and decreased fetal movement after being cleared from ER.  Explained to patient as the baby grows she will experience some SOB - pt. Didn't think it was coming from increased uterine size due to fetal growth. FOB at the bedside.

## 2016-06-07 NOTE — ED Provider Notes (Signed)
[facility] Emergency Department Provider Note ____________________________________________   I have reviewed the triage vital signs and the triage nursing note.  HISTORY  Chief Complaint Chest Pain   Historian Patient  HPI Shelby Long is a 20 y.o. female who is approximately [redacted] weeks pregnant, with a history of recent diagnosis of mild preeclampsia for which she was observed in the hospital and discharged home with observant/conservative management. She follows with [facility] and was there at the outpatient office today to have blood pressure measurement and NST and states that she was told to come to L&D at the hospital for evaluation for decreased fetal movement overall and high blood pressure concerning for preeclampsia.  When she arrived to L&D she also told the nurses there that she has been having intermittent chest pressure and shortness of breath at rest. She was sent down to the emergency department for further evaluation of those complaints. For me she states that she has had intermittent chest pressure and shortness of breath for the past couple of weeks, worse over the past 2 or 3 days as her blood pressure has been more elevated. No pleuritic chest pain.  She reports no productive cough, but has had a slightly low-grade temperature around 99 orally. No abdominal pain now.  He has had lower extremity bilateral pitting edema. She is currently not on any blood pressure medication.  She states that she received steroids for the baby on Wednesday.    Past Medical History:  Diagnosis Date  . Medical history non-contributory   . PONV (postoperative nausea and vomiting)     Patient Active Problem List   Diagnosis Date Noted  . Labor and delivery, indication for care 06/05/2016  . Abdominal pain in pregnancy, third trimester 06/04/2016  . Anxiety 06/02/2016  . History of depression 06/02/2016  . Leg swelling in pregnancy in third trimester  06/01/2016  . Pyelonephritis affecting pregnancy in first trimester 06/01/2016    Past Surgical History:  Procedure Laterality Date  . WISDOM TOOTH EXTRACTION  2015    Prior to Admission medications   Medication Sig Start Date End Date Taking? Authorizing Provider  furosemide (LASIX) 40 MG tablet Take 40 mg by mouth daily.  05/31/16  Yes Historical Provider, MD  nitrofurantoin, macrocrystal-monohydrate, (MACROBID) 100 MG capsule Take 100 mg by mouth at bedtime.   Yes Historical Provider, MD  Prenatal Vit-Fe Fumarate-FA (PRENATAL MULTIVITAMIN) TABS tablet Take 1 tablet by mouth daily at 12 noon.   Yes Historical Provider, MD    Allergies  Allergen Reactions  . Sulfa Antibiotics Anaphylaxis  . Penicillins Swelling    History reviewed. No pertinent family history.  Social History Social History  Substance Use Topics  . Smoking status: Never Smoker  . Smokeless tobacco: Never Used  . Alcohol use No    Review of Systems  Constitutional: Low-grade temperatures as above. Eyes: Negative for visual changes. ENT: Negative for sore throat. Cardiovascular: Negative for pleuritic chest pain. Some chest pressure intermittently. Respiratory: Positive for some shortness of breath as per history of present illness.. Gastrointestinal: Negative for abdominal pain, vomiting and diarrhea. Genitourinary: Negative for dysuria. Musculoskeletal: Negative for back pain. Skin: Negative for rash. Neurological: Negative for headache. 10 point Review of Systems otherwise negative ____________________________________________   PHYSICAL EXAM:  VITAL SIGNS: ED Triage Vitals  Enc Vitals Group     BP 06/07/16 1009 (!) 170/102     Pulse Rate 06/07/16 1009 99     Resp 06/07/16 1009 18  Temp 06/07/16 1009 98.7 F (37.1 C)     Temp Source 06/07/16 1009 Oral     SpO2 06/07/16 1009 100 %     Weight 06/07/16 1004 220 lb (99.8 kg)     Height 06/07/16 1004 5\' 8"  (1.727 m)     Head Circumference  --      Peak Flow --      Pain Score 06/07/16 1004 7     Pain Loc --      Pain Edu? --      Excl. in GC? --      Constitutional: Alert and oriented. Well appearing and in no distress. HEENT   Head: Normocephalic and atraumatic.      Eyes: Conjunctivae are normal. PERRL. Normal extraocular movements.      Ears:         Nose: No congestion/rhinnorhea.   Mouth/Throat: Mucous membranes are moist.   Neck: No stridor. Cardiovascular/Chest: Normal rate, regular rhythm.  No murmurs, rubs, or gallops. Respiratory: Normal respiratory effort without tachypnea nor retractions. Breath sounds are clear and equal bilaterally. No wheezes/rales/rhonchi. Gastrointestinal: Soft. No distention, no guarding, no rebound. Qualitative of the umbilicus.  Genitourinary/rectal:Deferred Musculoskeletal: Nontender with normal range of motion in all extremities. 2+ lower extremity pitting edema bilateral lower extremities. Neurologic:  Normal speech and language. No gross or focal neurologic deficits are appreciated. Skin:  Skin is warm, dry and intact. No rash noted. Psychiatric: Mood and affect are normal. Speech and behavior are normal. Patient exhibits appropriate insight and judgment.   ____________________________________________  LABS (pertinent positives/negatives)  Labs Reviewed  BASIC METABOLIC PANEL - Abnormal; Notable for the following:       Result Value   CO2 20 (*)    BUN 21 (*)    All other components within normal limits  CBC - Abnormal; Notable for the following:    WBC 11.9 (*)    RBC 3.71 (*)    Hemoglobin 11.8 (*)    HCT 34.4 (*)    Platelets 131 (*)    All other components within normal limits  BRAIN NATRIURETIC PEPTIDE - Abnormal; Notable for the following:    B Natriuretic Peptide 581.0 (*)    All other components within normal limits  HEPATIC FUNCTION PANEL - Abnormal; Notable for the following:    Total Protein 5.9 (*)    Albumin 3.1 (*)    Bilirubin, Direct  <0.1 (*)    All other components within normal limits  TROPONIN I  PROTIME-INR  URINALYSIS COMPLETEWITH MICROSCOPIC ([facility] ONLY)    ____________________________________________    EKG I, Governor Rooksebecca Eshani Maestre, MD, the attending physician have personally viewed and interpreted all ECGs.  61 bpm. Sinus rhythm. Narrow QRS, morphology of RSR'. Normal axis.  Nonspecific T wave. ____________________________________________  RADIOLOGY All Xrays were viewed by me. Imaging interpreted by Radiologist.  Chest x-ray two-view:FINDINGS: The heart size and mediastinal contours are within normal limits. Both lungs are clear. No pleural effusion or pneumothorax. The visualized skeletal structures are unremarkable.  IMPRESSION: Normal chest radiographs.  Ultrasound lower extremities bilateral:  FINDINGS: There is complete compressibility of the bilateral common femoral, femoral, and popliteal veins. Doppler analysis demonstrates respiratory phasicity and augmentation of flow with calf compression. No obvious superficial vein or calf vein thrombosis.  IMPRESSION: No evidence of lower extremity DVT.  __________________________________________  PROCEDURES  Procedure(s) performed: None  Critical Care performed: None  ____________________________________________   ED COURSE / ASSESSMENT AND PLAN  Pertinent labs & imaging results that were  available during my care of the patient were reviewed by me and considered in my medical decision making (see chart for details).   Ms. Ludwig Leanininger is here from ob office and L and D for increased BP's in history of recently being followed for third trimester preeclampsia.  She was initially sent to L&D and then down to the ED due to complaints of chest pressure and shortness of breath.  I reviewed her recent hospital stay, she has not had elevated LFTs, she does have a slightly low platelet count. At that point time she is having some abdominal discomfort,  no abdominal discomfort now. She tells me that her chest discomfort and shortness of breath has been intermittent but little bit worse over the last 3 days. Symptoms do not sound clinically consistent with pneumonia or PE, but she has not had a chest x-ray since onset of the symptoms I discussed with her obtaining chest x-ray.  Her x-ray is reassuring without overt pulmonary edema, pneumothorax, or infiltrate.  She's not hypoxic, febrile, nor having any pleuritic chest pain. I think her chest discomfort and shortness of breath or coming from the elevated blood pressures and associated with the preeclampsia heart strain. She was given a dose of IV labetalol due to blood pressures above 160/100 and with good improvement to 140 systolic over 90s.  Because of lower extremity edema which had not been imaged, I did discuss with patient and family to obtain ultrasound to rule out DVT because if positive would raise concern for PE. This imaging was negative. Given this case, I don't think that she needs additional further invasive imaging such as CT for PE.  I spoke with Dr. 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   CONSULTATIONS:   Dr. Greggory KeeneFrancesco, OB gyn, to evaluate in L and D area.   Patient / Family / Caregiver informed of clinical course, medical decision-making process, and agree with plan.   I discussed return precautions, follow-up instructions, and discharge instructions with patient and/or family.   ___________________________________________   FINAL CLINICAL  IMPRESSION(S) / ED DIAGNOSES   Final diagnoses:  Bilateral leg edema  Pre-eclampsia in third trimester              Note: This dictation was prepared with Dragon dictation. Any transcriptional errors that result from this process are unintentional    Governor Rooksebecca Theon Sobotka, MD 06/07/16 1421

## 2016-06-07 NOTE — Progress Notes (Signed)
NONSTRESS TEST INTERPRETATION  INDICATIONS: Preeclampsia  FHR baseline: 130bpm RESULTS:Inconclusive COMMENTS: Pt sent to ED and L&D for further evaluation as she c/o SOB and chest pain as well as elevated BPs with the highest being 152/100. Pt agreed with plan, L&D informed.    PLAN: 1. Continue fetal kick counts twice a day. 2. Continue antepartum testing as scheduled-Biweekly 3. Pt to be triaged immediately for SOB and Chest Pain   Debbe [place] Bradan Congrove, CMA

## 2016-06-07 NOTE — H&P (Signed)
Obstetric History and Physical  Shelby Long is a 20 y.o. G1P0 with IUP at 5125w2d presenting for admission for severe preeclampsia. Pt was evaluated in ER learlier today for SOB and Chest pain; PE/MI ruled out. Severe presure x 1 was managed with IV Labetalol 20 mg. Pt has persistent RUQ pain (normal LFTs) and intermittent HA. Platelets are 131k. Urine dip 3+. FM decreased, but FHR tracing Category 1 without contractions S/P Bmethasone x 2 doses  Prenatal Course Source of Care:[facility], transfered to [facility] at 32 weeks. Pregnancy complications or risks:  Patient Active Problem List   Diagnosis Date Noted  . Preeclampsia 06/07/2016  . Elevated blood pressure affecting pregnancy in third trimester, antepartum 06/07/2016  . Labor and delivery, indication for care 06/05/2016  . Abdominal pain in pregnancy, third trimester 06/04/2016  . Anxiety 06/02/2016  . History of depression 06/02/2016  . Leg swelling in pregnancy in third trimester 06/01/2016  . Pyelonephritis affecting pregnancy in first trimester 06/01/2016   She plans to breastfeed She desires ? for postpartum contraception.   Prenatal labs and studies: ABO, Rh: --/--/A POS (11/08 16100742) Antibody: NEG (11/08 [id number]) Rubella: Immune (05/24 0000) RPR: Nonreactive (05/24 0000)  HBsAg: Negative (05/24 0000)  HIV: Non-reactive (05/24 0000)  GBS: Unknown 1 hr Glucola normal Genetic screening normal Anatomy US normal   Past Medical History:  Diagnosis Date  . Medical history non-contributory   . PONV (postoperative nausea and vomiting)     Past Surgical History:  Procedure Laterality Date  . WISDOM TOOTH EXTRACTION  2015    OB History  Gravida Para Term Preterm AB Living  1            SAB TAB Ectopic Multiple Live Births               # Outcome Date GA Lbr Len/2nd Weight Sex Delivery Anes PTL Lv  1 Current               Social History   Social History  . Marital status: Single    Spouse name:  N/A  . Number of children: N/A  . Years of education: N/A   Social History Main Topics  . Smoking status: Never Smoker  . Smokeless tobacco: Never Used  . Alcohol use No  . Drug use: No  . Sexual activity: Yes   Other Topics Concern  . None   Social History Narrative  . None    History reviewed. No pertinent family history.  Prescriptions Prior to Admission  Medication Sig Dispense Refill Last Dose  . furosemide (LASIX) 40 MG tablet Take 40 mg by mouth daily.   3 Past Week at Unknown time  . nitrofurantoin, macrocrystal-monohydrate, (MACROBID) 100 MG capsule Take 100 mg by mouth at bedtime.   06/06/2016 at Unknown time  . Prenatal Vit-Fe Fumarate-FA (PRENATAL MULTIVITAMIN) TABS tablet Take 1 tablet by mouth daily at 12 noon.   06/06/2016 at Unknown time    Allergies  Allergen Reactions  . Sulfa Antibiotics Anaphylaxis  . Penicillins Swelling    Review of Systems: Negative except for what is mentioned in HPI.  Physical Exam: BP (!) 147/88   Pulse 75   Temp 98.2 F (36.8 C) (Oral)   Resp 18   Ht 5\' 8"  (1.727 m)   Wt 220 lb (99.8 kg)   SpO2 97%   BMI 33.45 kg/m  CONSTITUTIONAL: Well-developed, well-nourished female in no acute distress.  HENT:  Normocephalic, atraumatic EYES: Conjunctivae and  EOM are normal. Pupils are equal, round, and reactive to light. No scleral icterus.  NECK: Normal range of motion, supple, no masses SKIN: Skin is warm and dry. No rash noted. Not diaphoretic. No erythema. No pallor. NEUROLGIC: Alert and oriented to person, place, and time. Normal reflexes, muscle tone coordination. No cranial nerve deficit noted. PSYCHIATRIC: Normal mood and affect. Normal behavior. Normal judgment and thought content. CARDIOVASCULAR: Normal heart rate noted, regular rhythm RESPIRATORY: Effort and breath sounds normal, no problems with respiration noted ABDOMEN: Soft, nontender, nondistended, gravid. EFW 2000grams (U/S EFW 1900 gm on 06/05/2016 MUSCULOSKELETAL:  Normal range of motion. 3+ edema with 2 beat clonus and no tenderness.  Cervical Exam: Dilatation Ext Os open to FT, Internal Os closed  Effacement 30%   Station -3 Soft   Presentation: cephalic FHT:  Category 1  Contractions: none   Pertinent Labs/Studies:   Results for orders placed or performed during the hospital encounter of 06/07/16 (from the past 24 hour(s))  Basic metabolic panel     Status: Abnormal   Collection Time: 06/07/16 10:16 AM  Result Value Ref Range   Sodium 136 135 - 145 mmol/L   Potassium 3.6 3.5 - 5.1 mmol/L   Chloride 109 101 - 111 mmol/L   CO2 20 (L) 22 - 32 mmol/L   Glucose, Bld 79 65 - 99 mg/dL   BUN 21 (H) 6 - 20 mg/dL   Creatinine, Ser 1.610.79 0.44 - 1.00 mg/dL   Calcium 9.1 8.9 - 09.610.3 mg/dL   GFR calc non Af Amer >60 >60 mL/min   GFR calc Af Amer >60 >60 mL/min   Anion gap 7 5 - 15  CBC     Status: Abnormal   Collection Time: 06/07/16 10:16 AM  Result Value Ref Range   WBC 11.9 (H) 3.6 - 11.0 K/uL   RBC 3.71 (L) 3.80 - 5.20 MIL/uL   Hemoglobin 11.8 (L) 12.0 - 16.0 g/dL   HCT 04.534.4 (L) 40.935.0 - 81.147.0 %   MCV 92.9 80.0 - 100.0 fL   MCH 31.8 26.0 - 34.0 pg   MCHC 34.2 32.0 - 36.0 g/dL   RDW 91.413.8 78.211.5 - 95.614.5 %   Platelets 131 (L) 150 - 440 K/uL  Troponin I     Status: None   Collection Time: 06/07/16 10:16 AM  Result Value Ref Range   Troponin I <0.03 <0.03 ng/mL  Brain natriuretic peptide     Status: Abnormal   Collection Time: 06/07/16 10:16 AM  Result Value Ref Range   B Natriuretic Peptide 581.0 (H) 0.0 - 100.0 pg/mL  Hepatic function panel     Status: Abnormal   Collection Time: 06/07/16 10:16 AM  Result Value Ref Range   Total Protein 5.9 (L) 6.5 - 8.1 g/dL   Albumin 3.1 (L) 3.5 - 5.0 g/dL   AST 36 15 - 41 U/L   ALT 29 14 - 54 U/L   Alkaline Phosphatase 84 38 - 126 U/L   Total Bilirubin 0.4 0.3 - 1.2 mg/dL   Bilirubin, Direct <2.1<0.1 (L) 0.1 - 0.5 mg/dL   Indirect Bilirubin NOT CALCULATED 0.3 - 0.9 mg/dL  Protime-INR     Status: None    Collection Time: 06/07/16 10:16 AM  Result Value Ref Range   Prothrombin Time 12.0 11.4 - 15.2 seconds   INR 0.89   Urinalysis complete, with microscopic     Status: Abnormal   Collection Time: 06/07/16  2:14 PM  Result Value Ref Range  Color, Urine YELLOW (A) YELLOW   APPearance HAZY (A) CLEAR   Glucose, UA NEGATIVE NEGATIVE mg/dL   Bilirubin Urine NEGATIVE NEGATIVE   Ketones, ur NEGATIVE NEGATIVE mg/dL   Specific Gravity, Urine 1.020 1.005 - 1.030   Hgb urine dipstick 1+ (A) NEGATIVE   pH 6.0 5.0 - 8.0   Protein, ur >500 (A) NEGATIVE mg/dL   Nitrite NEGATIVE NEGATIVE   Leukocytes, UA NEGATIVE NEGATIVE   RBC / HPF 0-5 0 - 5 RBC/hpf   WBC, UA 0-5 0 - 5 WBC/hpf   Bacteria, UA FEW (A) NONE SEEN   Squamous Epithelial / LPF 0-5 (A) NONE SEEN   Mucous PRESENT    Hyaline Casts, UA PRESENT    Granular Casts, UA PRESENT    Amorphous Crystal PRESENT     Assessment : Shelby Long is a 20 y.o. G1P0 at [redacted]w[redacted]d being admitted with Severe Preeclampsia at 33.2 weeks S/P Betamethasone x 2 doses Unknown GBS status EFW 2000 grams  Plan: Admit Continuous EFM overnight PIH labs in AM Probable Cytotec/Pitocin IOL in AM after reassessment of Labs and PE Will need Magnesium Sulfate Prophylaxis and GBS prophylaxis Delivery plan: Hopeful for vaginal delivery  Shelby Long A. Beatris Si, MD, FACOG [facility]

## 2016-06-07 NOTE — Discharge Instructions (Addendum)
You were evaluated for high blood pressure with shortness of breath and chest discomfort in your 3rd trimester pregnancy and diagnosed with pre-eclampsia.  You were given an IV dose of blood pressure medication, labetalol, here in the emergency department. Go directly to OB/GYN triage for further evaluation and discussion with the OB/GYN about ongoing medication treatments and possible hospital admission.

## 2016-06-07 NOTE — ED Triage Notes (Signed)
[facility] with c/o hypertension, lack of fetal movement, ShOB, and CP.  [facility] L&D Sent the patient to the ED for evaluation of CP

## 2016-06-07 NOTE — Discharge Summary (Addendum)
OB Discharge Summary     Patient Name: Shelby Long DOB: 1995-10-22 MRN: [id number]  Date of admission: 06/05/2016 Delivering MD: This patient has no babies on file.  Date of discharge: 06/07/2016  Admitting diagnosis: 33 wks preg elevated blood pressure Intrauterine pregnancy: [redacted]w[redacted]d    Secondary diagnosis:  Active Problems: Abdominal Pain Elevated Blood Pressures in Pregnancy  Additional problems: Bilateral leg swelling of Pregnancy     Discharge diagnosis: Preeclampsia (mild)                                   Hospital course:  The patient was admitted for observation secondary to complaints of upper abdominal pain and elevated BPs. The patient had serial PIH labs performed and monitored BPs.  Her blood pressures remained in the mild range, and her labs showed evidence of mild pre-eclampsia.  The patient's abdominal pain resolved with rest and Tylenol. She received a full course of antenatal steroids. She was discharged home the following day.  Physical exam        Vitals:   06/05/16 0652 06/05/16 0734 06/05/16 0827 06/05/16 0834  BP: 129/75 (!) 144/82 (!) 146/89 (!) 145/84  Pulse: 77 73 83 99  Resp:  16    Temp:  98.4 F (36.9 C)    TempSrc:  Oral      General: alert and no distress Lochia: appropriate Uterine Fundus: gravid, non-tender Pelvis: deferred DVT Evaluation: Bilateral moderate leg swelling present. No evidence of DVT seen on physical exam.  Negative Homan's sign. No cords or calf tenderness. No significant calf/ankle edema.   Labs: Results for Shelby Long, Shelby Long(MRN [id number] as of 06/05/2016 12:18  Ref. Range 06/04/2016 15:18 06/04/2016 23:35 06/05/2016 07:42  Sodium Latest Ref Range: 135 - 145 mmol/L 138 137 138  Potassium Latest Ref Range: 3.5 - 5.1 mmol/L 4.2 4.0 4.3  Chloride Latest Ref Range: 101 - 111 mmol/L 111 111 109  CO2 Latest Ref Range: 22 - 32 mmol/L 23 20 (L) 20 (L)  BUN Latest Ref Range: 6 - 20 mg/dL 7 9 9   Creatinine  Latest Ref Range: 0.44 - 1.00 mg/dL 0.70 0.74 0.75  Calcium Latest Ref Range: 8.9 - 10.3 mg/dL 8.7 (L) 8.6 (L) 8.9  EGFR (Non-African Amer.) Latest Ref Range: >60 mL/min >60 >60 >60  EGFR (African American) Latest Ref Range: >60 mL/min >60 >60 >60  Glucose Latest Ref Range: 65 - 99 mg/dL 80 129 (H) 92  Anion gap Latest Ref Range: 5 - 15  4 (L) 6 9  Alkaline Phosphatase Latest Ref Range: 38 - 126 U/L 100 97 108  Albumin Latest Ref Range: 3.5 - 5.0 g/dL 3.0 (L) 2.8 (L) 3.1 (L)  AST Latest Ref Range: 15 - 41 U/L 37 33 31  ALT Latest Ref Range: 14 - 54 U/L 31 29 29   Total Protein Latest Ref Range: 6.5 - 8.1 g/dL 6.0 (L) 5.7 (L) 6.4 (L)  Total Bilirubin Latest Ref Range: 0.3 - 1.2 mg/dL 0.4 0.2 (L) 0.3  WBC Latest Ref Range: 3.6 - 11.0 K/uL 8.2 8.6 10.8  RBC Latest Ref Range: 3.80 - 5.20 MIL/uL 3.63 (L) 3.81 4.19  Hemoglobin Latest Ref Range: 12.0 - 16.0 g/dL 11.7 (L) 12.2 13.5  HCT Latest Ref Range: 35.0 - 47.0 % 34.0 (L) 35.2 39.2  MCV Latest Ref Range: 80.0 - 100.0 fL 93.7 92.4 93.6  MCH Latest Ref Range:  26.0 - 34.0 pg 32.2 32.1 32.2  MCHC Latest Ref Range: 32.0 - 36.0 g/dL 34.4 34.7 34.4  RDW Latest Ref Range: 11.5 - 14.5 % 13.6 13.6 13.8  Platelets Latest Ref Range: 150 - 440 K/uL 126 (L) 129 (L) 145 (L)     Results for Shelby Long, Shelby Long (MRN [id number]) as of 06/05/2016 12:18  Ref. Range 06/04/2016 15:03  Total Protein, Urine Latest Units: mg/dL 430  Protein Creatinine Ratio Latest Ref Range: 0.00 - 0.15 mg/mgCre 2.67 (H)  Creatinine, Urine Latest Units: mg/dL 161      Ultrasound 06/04/2016:  EXAM: OBSTETRICAL ULTRASOUND >14 WKS  FINDINGS: Number of Fetuses: 1  Heart Rate: 143 bpm  Movement: Present  Presentation: Cephalic  Previa: No  Placental Location: Anterior fundal  Amniotic Fluid (Subjective): Normal  Amniotic Fluid (Objective):  AFI 11.5 cm (5%ile= 8.6 cm, 95%= 24.2 cm for 32 wks)  FETAL BIOMETRY  BPD: 8.1cm 32w 3d  HC:  29.7cm 32w 6d  AC: 28.0cm 32w 0d  FL: 6.3cm 32w 4d  Current Mean GA: 32w 3d [place] EDC: 07/27/2016  Estimated Fetal Weight: 1,942g 24%ile  FETAL ANATOMY  Lateral Ventricles: Visualized  Thalami/CSP: Visualized  Posterior Fossa: Visualized  Upper Lip: Visualized  Spine: Visualized  4 Chamber Heart on Left: Visualized  LVOT: Not visualized  RVOT: Not visualized  Stomach on Left: Visualized  3 Vessel Cord: Visualized  Cord Insertion site: Visualized  Kidneys: Visualized  Bladder: Visualized  Extremities: Visualized  Technically difficult due to: Study was limited due to gestational age.  Maternal Findings:  Cervix: 4.0 cm and closed.  IMPRESSION: Single viable intrauterine pregnancy at 32 weeks 3 days.    Discharge instruction: f/u in 2 days for NST, repeat labs, and BP check.    After visit meds:    Medication List    ASK your doctor about these medications   nitrofurantoin (macrocrystal-monohydrate) 100 MG capsule Commonly known as:  MACROBID Take 100 mg by mouth at bedtime.   prenatal multivitamin Tabs tablet Take 1 tablet by mouth daily at 12 noon.       Diet: routine diet  Activity: Modified bed rest.  Patient provided work notice to remain out of work until end of postpartum period.   Outpatient follow up:2 days for NST, BP check, and labs   Follow up Appt:Future Appointments Date Time Provider [place]  06/13/2016 3:00 PM [facility] [place] None  06/13/2016 3:45 PM Rubie Maid, MD [facility] None     06/07/2016 Rubie Maid, MD

## 2016-06-07 NOTE — Progress Notes (Signed)
Clarification of orders per Dr. Greggory Keenefrancesco - may start IV and draw all labs in AM as long as BPs stay stable.

## 2016-06-08 LAB — CBC WITH DIFFERENTIAL/PLATELET
Basophils Absolute: 0 10*3/uL (ref 0–0.1)
Basophils Relative: 0 %
EOS ABS: 0 10*3/uL (ref 0–0.7)
Eosinophils Relative: 0 %
HEMATOCRIT: 37.9 % (ref 35.0–47.0)
HEMOGLOBIN: 12.8 g/dL (ref 12.0–16.0)
LYMPHS ABS: 2 10*3/uL (ref 1.0–3.6)
Lymphocytes Relative: 16 %
MCH: 31.5 pg (ref 26.0–34.0)
MCHC: 33.7 g/dL (ref 32.0–36.0)
MCV: 93.4 fL (ref 80.0–100.0)
MONO ABS: 0.9 10*3/uL (ref 0.2–0.9)
MONOS PCT: 7 %
NEUTROS ABS: 9.4 10*3/uL — AB (ref 1.4–6.5)
NEUTROS PCT: 77 %
Platelets: 131 10*3/uL — ABNORMAL LOW (ref 150–440)
RBC: 4.06 MIL/uL (ref 3.80–5.20)
RDW: 13.4 % (ref 11.5–14.5)
WBC: 12.3 10*3/uL — ABNORMAL HIGH (ref 3.6–11.0)

## 2016-06-08 LAB — COMPREHENSIVE METABOLIC PANEL
ALBUMIN: 2.6 g/dL — AB (ref 3.5–5.0)
ALK PHOS: 93 U/L (ref 38–126)
ALT: 44 U/L (ref 14–54)
ALT: 88 U/L — ABNORMAL HIGH (ref 14–54)
ANION GAP: 6 (ref 5–15)
ANION GAP: 8 (ref 5–15)
AST: 50 U/L — ABNORMAL HIGH (ref 15–41)
AST: 85 U/L — ABNORMAL HIGH (ref 15–41)
Albumin: 2.6 g/dL — ABNORMAL LOW (ref 3.5–5.0)
Alkaline Phosphatase: 84 U/L (ref 38–126)
BILIRUBIN TOTAL: 0.4 mg/dL (ref 0.3–1.2)
BUN: 20 mg/dL (ref 6–20)
BUN: 18 mg/dL (ref 6–20)
CALCIUM: 7.8 mg/dL — AB (ref 8.9–10.3)
CO2: 20 mmol/L — AB (ref 22–32)
CO2: 19 mmol/L — ABNORMAL LOW (ref 22–32)
Calcium: 8 mg/dL — ABNORMAL LOW (ref 8.9–10.3)
Chloride: 112 mmol/L — ABNORMAL HIGH (ref 101–111)
Chloride: 108 mmol/L (ref 101–111)
Creatinine, Ser: 0.9 mg/dL (ref 0.44–1.00)
Creatinine, Ser: 0.94 mg/dL (ref 0.44–1.00)
GFR calc Af Amer: >60 mL/min (ref >60)
GFR calc non Af Amer: >60 mL/min (ref >60)
GLUCOSE: 78 mg/dL (ref 65–99)
Glucose, Bld: 84 mg/dL (ref 65–99)
POTASSIUM: 3.7 mmol/L (ref 3.5–5.1)
Potassium: 3.7 mmol/L (ref 3.5–5.1)
SODIUM: 138 mmol/L (ref 135–145)
Sodium: 135 mmol/L (ref 135–145)
TOTAL PROTEIN: 5.5 g/dL — AB (ref 6.5–8.1)
Total Bilirubin: 0.4 mg/dL (ref 0.3–1.2)
Total Protein: 5.4 g/dL — ABNORMAL LOW (ref 6.5–8.1)

## 2016-06-08 LAB — CBC
HEMATOCRIT: 31.8 % — AB (ref 35.0–47.0)
HEMOGLOBIN: 11.2 g/dL — AB (ref 12.0–16.0)
MCH: 32.3 pg (ref 26.0–34.0)
MCHC: 35.1 g/dL (ref 32.0–36.0)
MCV: 92 fL (ref 80.0–100.0)
Platelets: 124 10*3/uL — ABNORMAL LOW (ref 150–440)
RBC: 3.46 MIL/uL — AB (ref 3.80–5.20)
RDW: 13.7 % (ref 11.5–14.5)
WBC: 10.8 10*3/uL (ref 3.6–11.0)

## 2016-06-08 LAB — TYPE AND SCREEN
ABO/RH(D): A POS
Antibody Screen: POSITIVE

## 2016-06-08 LAB — URIC ACID: Uric Acid, Serum: 9.9 mg/dL — ABNORMAL HIGH (ref 2.3–6.6)

## 2016-06-08 LAB — PROTEIN / CREATININE RATIO, URINE
CREATININE, URINE: 227 mg/dL
Protein Creatinine Ratio: 7.61 mg/mg{Cre} — ABNORMAL HIGH (ref 0.00–0.15)
Total Protein, Urine: 1728 mg/dL

## 2016-06-08 MED ORDER — DIPHENHYDRAMINE HCL 50 MG/ML IJ SOLN
INTRAMUSCULAR | Status: AC
  Administered 2016-06-08: 25 mg via INTRAVENOUS
  Filled 2016-06-08: qty 1

## 2016-06-08 MED ORDER — VANCOMYCIN HCL IN DEXTROSE 1-5 GM/200ML-% IV SOLN
1000.0000 mg | Freq: Two times a day (BID) | INTRAVENOUS | Status: DC
  Administered 2016-06-08: 1000 mg via INTRAVENOUS
  Filled 2016-06-08 (×2): qty 200

## 2016-06-08 MED ORDER — DIPHENHYDRAMINE HCL 50 MG/ML IJ SOLN
25.0000 mg | Freq: Four times a day (QID) | INTRAMUSCULAR | Status: DC | PRN
  Administered 2016-06-08 (×2): 25 mg via INTRAVENOUS

## 2016-06-08 MED ORDER — CLINDAMYCIN PHOSPHATE 900 MG/50ML IV SOLN
900.0000 mg | Freq: Three times a day (TID) | INTRAVENOUS | Status: DC
  Administered 2016-06-08: 900 mg via INTRAVENOUS
  Filled 2016-06-08: qty 50

## 2016-06-08 MED ORDER — CALCIUM GLUCONATE 10 % IV SOLN
INTRAVENOUS | Status: AC
  Administered 2016-06-09: 1 g via INTRAVENOUS
  Filled 2016-06-08: qty 10

## 2016-06-08 MED ORDER — MISOPROSTOL 25 MCG QUARTER TABLET
50.0000 ug | ORAL_TABLET | ORAL | Status: DC
  Administered 2016-06-08 – 2016-06-09 (×3): 50 ug via VAGINAL
  Filled 2016-06-08: qty 0.25
  Filled 2016-06-08: qty 0.5

## 2016-06-08 MED ORDER — MISOPROSTOL 25 MCG QUARTER TABLET
25.0000 ug | ORAL_TABLET | ORAL | Status: DC | PRN
  Administered 2016-06-08 (×2): 25 ug via VAGINAL
  Filled 2016-06-08 (×2): qty 1
  Filled 2016-06-08: qty 0.5
  Filled 2016-06-08: qty 1

## 2016-06-08 MED ORDER — DIPHENHYDRAMINE HCL 50 MG/ML IJ SOLN
25.0000 mg | Freq: Once | INTRAMUSCULAR | Status: AC
  Administered 2016-06-08: 25 mg via INTRAVENOUS

## 2016-06-08 MED ORDER — LACTATED RINGERS IV SOLN
INTRAVENOUS | Status: DC
  Administered 2016-06-08 – 2016-06-09 (×4): via INTRAVENOUS

## 2016-06-08 MED ORDER — TERBUTALINE SULFATE 1 MG/ML IJ SOLN: 0.2500 mg | Freq: Once | INTRAMUSCULAR | Status: DC | PRN

## 2016-06-08 MED ORDER — SODIUM CHLORIDE FLUSH 0.9 % IV SOLN
INTRAVENOUS | Status: AC
  Filled 2016-06-08: qty 30

## 2016-06-08 NOTE — Consult Note (Signed)
Neonatology Prenatal Consultation: Requested by: Defrancesco Reason: IOL for HELLP  I met with Mr. & Mrs. Shelby Long.   Mrs. Shelby Long is beginning an induction of labor and is being treated with betamethasone.  She has been diagnosed with HELLP syndrome.  The EFW is ~1.8 kg which is AGA.    I explained the respiratory, glucose, feeding, and temperature regulation problems as well as the increased risk for infection among prematurely born babies at [redacted] weeks EGA.  We discussed the kinds of treatments that might be required and the possible duration of hospital care that could be expected in typical cases.  Shelby Long.D.

## 2016-06-08 NOTE — Progress Notes (Signed)
Patient reports swelling of her lips after completion of IV antibiotics, denies itching, shortness of breath or any other symptoms. MD notified and order obtained for benadryl.

## 2016-06-08 NOTE — Progress Notes (Signed)
In room to assess patient and discuss plan of care for the day. Pt and family verbalized understanding of meeting with physician and discussing induction this morning.

## 2016-06-08 NOTE — Progress Notes (Signed)
Labor & Delivery:  33.[redacted] weeks EGA PIH labs demonstrating evolving HELLP Syndrome BP stable overnight.  CBC Latest Ref Rng & Units 06/08/2016 06/07/2016 06/05/2016  WBC 3.6 - 11.0 K/uL 10.8 11.9(H) 10.8  Hemoglobin 12.0 - 16.0 g/dL 11.2(L) 11.8(L) 13.5  Hematocrit 35.0 - 47.0 % 31.8(L) 34.4(L) 39.2  Platelets 150 - 440 K/uL 124(L) 131(L) 145(L)   CMP Latest Ref Rng & Units 06/08/2016 06/07/2016 06/05/2016  Glucose 65 - 99 mg/dL 78 79 92  BUN 6 - 20 mg/dL 20 16(X21(H) 9  Creatinine 0.44 - 1.00 mg/dL 0.960.90 0.450.79 4.090.75  Sodium 135 - 145 mmol/L 138 136 138  Potassium 3.5 - 5.1 mmol/L 3.7 3.6 4.3  Chloride 101 - 111 mmol/L 112(H) 109 109  CO2 22 - 32 mmol/L 20(L) 20(L) 20(L)  Calcium 8.9 - 10.3 mg/dL 8.0(L) 9.1 8.9  Total Protein 6.5 - 8.1 g/dL 8.1(X5.4(L) 5.9(L) 6.4(L)  Total Bilirubin 0.3 - 1.2 mg/dL 0.4 0.4 0.3  Alkaline Phos 38 - 126 U/L 84 84 108  AST 15 - 41 U/L 50(H) 36 31  ALT 14 - 54 U/L 44 29 29  Uric Acid 9.9 Urine P/C ratio pending  Objective: BP 139/78 (BP Location: Left Arm)   Pulse 62   Temp 98.6 F (37 C) (Oral)   Resp 16   Ht 5\' 8"  (1.727 m)   Wt 220 lb (99.8 kg)   SpO2 97%   BMI 33.45 kg/m  EFW 4pounds Vertex FHR-Category 1 Cervix-30%/closed/Soft/vertex/-3 Edema 3+ DTR 3+/4 with 3 beat clonus  Assessment: 33.2 week IUP with evolving HELLP Syndrome  Plan: Cytotec/Pitocin IOL Magnesium Sulfate prophylaxis GBS Prophylaxis NICU aware  Herold HarmsMartin A Zackarey Holleman, MD

## 2016-06-08 NOTE — Progress Notes (Signed)
Labor note: S:Pt reports tongue and lip swelling with both Vancomycin and Clindamycin administration, treated with benadryl O: BP (!) 150/97 (BP Location: Right Arm)   Pulse 92   Temp 98.1 F (36.7 C) (Oral)   Resp 20   Ht 5\' 8"  (1.727 m)   Wt 220 lb (99.8 kg)   SpO2 94%   BMI 33.45 kg/m  Cervix: FT/60/-1/vertex much better applied to cervix/BOWI FHR: Cat 1; contractions not being picked up on monitor A: Definite change with cytotec      HELLP - LFT's increasing; plts   CMP     Component Value Date/Time   NA 135 06/08/2016 1755   NA 141 05/30/2016 1700   K 3.7 06/08/2016 1755   CL 108 06/08/2016 1755   CO2 19 (L) 06/08/2016 1755   GLUCOSE 84 06/08/2016 1755   BUN 18 06/08/2016 1755   BUN 5 (L) 05/30/2016 1700   CREATININE 0.94 06/08/2016 1755   CALCIUM 7.8 (L) 06/08/2016 1755   PROT 5.5 (L) 06/08/2016 1755   PROT 5.5 (L) 05/30/2016 1700   ALBUMIN 2.6 (L) 06/08/2016 1755   ALBUMIN 3.1 (L) 05/30/2016 1700   AST 85 (H) 06/08/2016 1755   ALT 88 (H) 06/08/2016 1755   ALKPHOS 93 06/08/2016 1755   BILITOT 0.4 06/08/2016 1755   BILITOT <0.2 05/30/2016 1700   GFRNONAA >60 06/08/2016 1755   GFRAA >60 06/08/2016 1755   CBC    Component Value Date/Time   WBC 12.3 (H) 06/08/2016 1755   RBC 4.06 06/08/2016 1755   HGB 12.8 06/08/2016 1755   HCT 37.9 06/08/2016 1755   HCT 33.9 (L) 05/30/2016 1700   PLT 131 (L) 06/08/2016 1755   PLT 161 05/30/2016 1700   MCV 93.4 06/08/2016 1755   MCV 93 05/30/2016 1700   MCH 31.5 06/08/2016 1755   MCHC 33.7 06/08/2016 1755   RDW 13.4 06/08/2016 1755   RDW 13.8 05/30/2016 1700   LYMPHSABS 2.0 06/08/2016 1755   MONOABS 0.9 06/08/2016 1755   EOSABS 0.0 06/08/2016 1755   BASOSABS 0.0 06/08/2016 1755   P: Continue cytotec x 1 more dose; No other antibiotic for GBS prophylaxis - nursery aware; pitocin to start at 7am  Herold HarmsMartin A Defrancesco, MD

## 2016-06-08 NOTE — Progress Notes (Signed)
Pt reports lip and tongue swelling resolved. No other complaints

## 2016-06-08 NOTE — Progress Notes (Signed)
Patient had a swelling reaction to Clindamycin after antibiotic was completed. Reports swollen tongue and lips. Denies itching, SOB, or other symptoms. MD made aware.

## 2016-06-09 ENCOUNTER — Encounter: Payer: Self-pay | Admitting: Anesthesiology

## 2016-06-09 LAB — CULTURE, BETA STREP (GROUP B ONLY)

## 2016-06-09 LAB — COMPREHENSIVE METABOLIC PANEL
ALBUMIN: 2.5 g/dL — AB (ref 3.5–5.0)
ALK PHOS: 95 U/L (ref 38–126)
ALT: 75 U/L — AB (ref 14–54)
AST: 63 U/L — AB (ref 15–41)
Anion gap: 7 (ref 5–15)
BUN: 20 mg/dL (ref 6–20)
CALCIUM: 7.1 mg/dL — AB (ref 8.9–10.3)
CHLORIDE: 106 mmol/L (ref 101–111)
CO2: 21 mmol/L — AB (ref 22–32)
CREATININE: 0.96 mg/dL (ref 0.44–1.00)
GFR calc non Af Amer: >60 mL/min (ref >60)
GLUCOSE: 80 mg/dL (ref 65–99)
Potassium: 3.9 mmol/L (ref 3.5–5.1)
SODIUM: 134 mmol/L — AB (ref 135–145)
Total Bilirubin: 0.5 mg/dL (ref 0.3–1.2)
Total Protein: 5.2 g/dL — ABNORMAL LOW (ref 6.5–8.1)

## 2016-06-09 LAB — CBC
HCT: 39.7 % (ref 35.0–47.0)
Hemoglobin: 13.3 g/dL (ref 12.0–16.0)
MCH: 31.6 pg (ref 26.0–34.0)
MCHC: 33.6 g/dL (ref 32.0–36.0)
MCV: 94 fL (ref 80.0–100.0)
PLATELETS: 92 10*3/uL — AB (ref 150–440)
RBC: 4.22 MIL/uL (ref 3.80–5.20)
RDW: 13.7 % (ref 11.5–14.5)
WBC: 12.5 10*3/uL — AB (ref 3.6–11.0)

## 2016-06-09 LAB — CBC WITH DIFFERENTIAL/PLATELET
BASOS ABS: 0 10*3/uL (ref 0–0.1)
BASOS PCT: 0 %
EOS ABS: 0 10*3/uL (ref 0–0.7)
EOS PCT: 0 %
HCT: 36.6 % (ref 35.0–47.0)
HEMOGLOBIN: 12.6 g/dL (ref 12.0–16.0)
LYMPHS ABS: 1.7 10*3/uL (ref 1.0–3.6)
Lymphocytes Relative: 16 %
MCH: 31.9 pg (ref 26.0–34.0)
MCHC: 34.4 g/dL (ref 32.0–36.0)
MCV: 92.7 fL (ref 80.0–100.0)
Monocytes Absolute: 0.8 10*3/uL (ref 0.2–0.9)
Monocytes Relative: 7 %
NEUTROS PCT: 77 %
Neutro Abs: 8.6 10*3/uL — ABNORMAL HIGH (ref 1.4–6.5)
PLATELETS: 103 10*3/uL — AB (ref 150–440)
RBC: 3.95 MIL/uL (ref 3.80–5.20)
RDW: 13.6 % (ref 11.5–14.5)
WBC: 11.2 10*3/uL — AB (ref 3.6–11.0)

## 2016-06-09 MED ORDER — TERBUTALINE SULFATE 1 MG/ML IJ SOLN: 0.2500 mg | Freq: Once | INTRAMUSCULAR | Status: DC | PRN

## 2016-06-09 MED ORDER — OXYTOCIN 40 UNITS IN LACTATED RINGERS INFUSION - SIMPLE MED
1.0000 m[IU]/min | INTRAVENOUS | Status: DC
  Administered 2016-06-09: 2 m[IU]/min via INTRAVENOUS

## 2016-06-09 MED ORDER — LIDOCAINE HCL (PF) 1 % IJ SOLN
INTRAMUSCULAR | Status: AC
  Filled 2016-06-09: qty 30

## 2016-06-09 MED ORDER — AMMONIA AROMATIC IN INHA
RESPIRATORY_TRACT | Status: AC
  Filled 2016-06-09: qty 10

## 2016-06-09 MED ORDER — MISOPROSTOL 200 MCG PO TABS
ORAL_TABLET | ORAL | Status: AC
  Filled 2016-06-09: qty 4

## 2016-06-09 MED ORDER — CALCIUM GLUCONATE 10 % IV SOLN
1.0000 g | Freq: Once | INTRAVENOUS | Status: AC
  Administered 2016-06-09: 1 g via INTRAVENOUS

## 2016-06-09 MED ORDER — CALCIUM GLUCONATE 10 % IV SOLN
10.0000 g | Freq: Once | INTRAVENOUS | Status: AC
  Administered 2016-06-09: 10 g via INTRAVENOUS

## 2016-06-09 MED ORDER — SODIUM CHLORIDE 0.9 % IJ SOLN
INTRAMUSCULAR | Status: AC
  Filled 2016-06-09: qty 50

## 2016-06-09 MED ORDER — OXYTOCIN 10 UNIT/ML IJ SOLN
INTRAMUSCULAR | Status: AC
  Filled 2016-06-09: qty 2

## 2016-06-09 NOTE — Progress Notes (Signed)
[facility] called for transport, spoke with Greig CastillaAndrew, he will call back with pickup time.

## 2016-06-09 NOTE — Progress Notes (Signed)
Madelyne A Nininger is a 20 y.o. G1P0 at 6975w4d by ultrasound admitted for Severe Preeclampsia  Subjective: Intermittent HA  Objective: BP (!) 138/91   Pulse 75   Temp 98.7 F (37.1 C) (Oral)   Resp 18   Ht 5\' 8"  (1.727 m)   Wt 220 lb (99.8 kg)   SpO2 92%   BMI 33.45 kg/m  I/O last 3 completed shifts: In: 2996.3 [I.V.:2796.3; IV Piggyback:200] Out: 2375 [Urine:2375] Total I/O In: 300 [I.V.:300] Out: 195 [Urine:195]  FHT:  Category 1 UC:   none SVE:   Dilation:1-2/80/-2/vertex/AROM Clear/IUPC placed Effacement (%): 80 Station: -2 Exam by:: Dr. Greggory Keendefrancesco   Labs: Lab Results  Component Value Date   WBC 11.2 (H) 06/09/2016   HGB 12.6 06/09/2016   HCT 36.6 06/09/2016   MCV 92.7 06/09/2016   PLT 103 (L) 06/09/2016    Assessment / Plan: Induction of labor due to HELLP,  progressing slowly, stable AROM with IUPC placement Avoid FSE if possible  Labor: Induction Preeclampsia:  on magnesium sulfate and no signs or symptoms of toxicity Fetal Wellbeing:  Category I Pain Control:  Labor support without medications and IV pain meds I/D:  n/a Anticipated MOD:  Uncertain  Printice Hellmer A Keny Donald 06/09/2016, 9:57 AM

## 2016-06-09 NOTE — Progress Notes (Addendum)
Shelby Long is a 20 y.o. G1P0 at 2369w4d by ultrasound admitted for Severe Preeclampsia now with HELLP.  Spent time with patient, her family and nursing staff.  Also spoke with Dr. Greggory Keenefrancesco about the patient.   The patients platelets continue to drop and are now 92K down from 131K yesterday evening.   Currently the patient is only dilated 2 cm per report. Our blood bank reports that platelets would have to come from [facility] for this patient but would only have to come from the main [facility] in [place]  if the patient was at the [facility] in [place]. I have recommend to her care team that the safest option for this patient at this time may be to have her transfered to a center with a higher level of OB care that has platelets in house in case the patient requires multiple transfusions.  At the minimum, I would like platelets available in our hospital if the plan is to have the patient deliver her baby here. Plan discussed with OB team, patient and her family that I defer the choice to [facility] the [facility].

## 2016-06-09 NOTE — Progress Notes (Addendum)
LABOR NOTE:  S: Comfortable. Intermittent HA. Pitocin started at 6 AM.  O: BP 131/84   Pulse 82   Temp 98.9 F (37.2 C) (Oral)   Resp 18   Ht 5\' 8"  (1.727 m)   Wt 220 lb (99.8 kg)   SpO2 94%   BMI 33.45 kg/m  FHR: Category 1; No regular contractions Cervix: 1-2/80/-2/vertex/BOWI DTR's present. U.O adequate  CBC Latest Ref Rng & Units 06/09/2016 06/08/2016 06/08/2016  WBC 3.6 - 11.0 K/uL 11.2(H) 12.3(H) 10.8  Hemoglobin 12.0 - 16.0 g/dL 84.612.6 96.212.8 11.2(L)  Hematocrit 35.0 - 47.0 % 36.6 37.9 31.8(L)  Platelets 150 - 440 K/uL 103(L) 131(L) 124(L)   CMP Latest Ref Rng & Units 06/09/2016 06/08/2016 06/08/2016  Glucose 65 - 99 mg/dL 80 84 78  BUN 6 - 20 mg/dL 20 18 20   Creatinine 0.44 - 1.00 mg/dL 9.520.96 8.410.94 3.240.90  Sodium 135 - 145 mmol/L 134(L) 135 138  Potassium 3.5 - 5.1 mmol/L 3.9 3.7 3.7  Chloride 101 - 111 mmol/L 106 108 112(H)  CO2 22 - 32 mmol/L 21(L) 19(L) 20(L)  Calcium 8.9 - 10.3 mg/dL 7.1(L) 7.8(L) 8.0(L)  Total Protein 6.5 - 8.1 g/dL 5.2(L) 5.5(L) 5.4(L)  Total Bilirubin 0.3 - 1.2 mg/dL 0.5 0.4 0.4  Alkaline Phos 38 - 126 U/L 95 93 84  AST 15 - 41 U/L 63(H) 85(H) 50(H)  ALT 14 - 54 U/L 75(H) 88(H) 44   A: Slow progress      HELLP stable, platelets slightly lower  P: Continue Pitocin IOL      Will AROM at next assessment and internalize with FSE/IUPC to optimize induction process.      IVF restriction to 125 ml/hr continues  Herold HarmsMartin A Kathrina Crosley, MD

## 2016-06-09 NOTE — Progress Notes (Signed)
[facility] here to transport patient to [facility] per acceptance by MD. Eldridge Abrahams. [place]. When team arrived, report was given to RN, EMTALA form completed and records given. While giving report, other [facility] RN transferred LR and magnesium to their tubing and IV pump. Patient began to say she felt hot, couldn't breathe. Assessed lungs, clear, pt repositioned, then looked at meds running with [facility] pump, could see magnesium running at a rapid rate. Checked rate with [facility] RN, pump showed rate at 25, however, could visibly see magnesium running at a much higher drip rate. Tubing clamped off, removed from pt. Assessed reflexes, no patellar reflexes found. Calcium gluconate 10 mg given IV, rapid change in patient status, breathing easier, more alert. Reflexes rechecked in 5 minutes, 1+ in right leg. Report [facility] to [facility] team, transport en route.

## 2016-06-09 NOTE — Discharge Summary (Signed)
OB TRANSFER NOTE:  Date of transfer: 06/09/2016  Transferring facility[facility] Receiving [facility]  HISTORY: 20 year old white female gravida 1 para 0 at 33.[redacted] weeks gestation, presents for inpatient transfer to [facility] and delivery for management of severe preeclampsia/HELLP syndrome.  06/07/2016-admission [facility] ER following workup of shortness of breath and chest pain where PE/MI were ruled out. Patient has severe preeclampsia and was given one dose of labetalol 20 mg IV in the emergency room to lower severe pressures. Patient has not had any need for subsequent antihypertensive therapy. Patient is status post 2 doses of betamethasone given at approximately 32 weeks. GBS status is unknown; patient has anaphylaxis allergy to penicillin.  06/08/2016-Cytotec cervical ripening was started and continued for 24 hours. Magnesium sulfate prophylaxis was started. Group B strep prophylaxis was initiated with vancomycin; patient received 1 dose with subsequent discontinuation of medication due to lip and tongue swelling. Medication was switched to clindamycin, with subsequent allergic reaction symptoms being noted again with the patient experiencing lip and tongue swelling. Symptoms were treated with IV Benadryl. She has not been given any other antibiotics for GBS prophylaxis. NICU is aware.  06/09/2016-Pitocin augmentation of labor was begun at 6 AM. Amniotomy with clear fluid was performed at 10 AM, and IUPC was placed. Slow progress was noted and anticipated. However, platelet count dropped below 100,000 and patient was not a candidate for epidural. She was being managed with nitrous for pain control. Fetal heart rate tracing has remained category 1.  Due to evolving HELLP syndrome, facility inability to have access to adequate platelets if needed in the future, decision is made to transfer patient to tertiary care center which has  full  blood product availability.  Past Medical History:  Diagnosis Date  . Medical history non-contributory   . PONV (postoperative nausea and vomiting)    Past Surgical History:  Procedure Laterality Date  . WISDOM TOOTH EXTRACTION  2015   Patient Active Problem List   Diagnosis Date Noted  . Preeclampsia 06/07/2016  . Elevated blood pressure affecting pregnancy in third trimester, antepartum 06/07/2016  . HELLP syndrome (HELLP), third trimester 06/07/2016  . Labor and delivery, indication for care 06/05/2016  . Abdominal pain in pregnancy, third trimester 06/04/2016  . Anxiety 06/02/2016  . History of depression 06/02/2016  . Leg swelling in pregnancy in third trimester 06/01/2016  . Pyelonephritis affecting pregnancy in first trimester 06/01/2016    OBJECTIVE: BP (!) 148/85   Pulse (!) 109   Temp 98.5 F (36.9 C) (Oral)   Resp 18   Ht 5\' 8"  (1.727 m)   Wt 220 lb (99.8 kg)   SpO2 99%   BMI 33.45 kg/m  Patient is alert and oriented, uncomfortable with contractions Lungs-clear Heart-regular rate and rhythm without murmur S3 or S4 Abdomen-gravid, nontender; estimated fetal weight 4 pounds Pelvic-vulvar edema present; cervix: 1-2/80%/-3/vertex/clear amniotic fluid; recently placed IUPC is removed for transport Extremities-3+ edema DTRs-3+/4 with 2 beats clonus  Urine output-adequate (greater than 30 cc per hour)  CBC Latest Ref Rng & Units 06/09/2016 06/09/2016 06/08/2016  WBC 3.6 - 11.0 K/uL 12.5(H) 11.2(H) 12.3(H)  Hemoglobin 12.0 - 16.0 g/dL 40.913.3 81.112.6 91.412.8  Hematocrit 35.0 - 47.0 % 39.7 36.6 37.9  Platelets 150 - 440 K/uL 92(L) 103(L) 131(L)   CMP Latest Ref Rng & Units 06/09/2016 06/08/2016 06/08/2016  Glucose 65 - 99 mg/dL 80 84 78  BUN 6 - 20 mg/dL 20 18 20   Creatinine  0.44 - 1.00 mg/dL 9.600.96 4.540.94 0.980.90  Sodium 135 - 145 mmol/L 134(L) 135 138  Potassium 3.5 - 5.1 mmol/L 3.9 3.7 3.7  Chloride 101 - 111 mmol/L 106 108 112(H)  CO2 22 - 32 mmol/L 21(L) 19(L)  20(L)  Calcium 8.9 - 10.3 mg/dL 7.1(L) 7.8(L) 8.0(L)  Total Protein 6.5 - 8.1 g/dL 5.2(L) 5.5(L) 5.4(L)  Total Bilirubin 0.3 - 1.2 mg/dL 0.5 0.4 0.4  Alkaline Phos 38 - 126 U/L 95 93 84  AST 15 - 41 U/L 63(H) 85(H) 50(H)  ALT 14 - 54 U/L 75(H) 88(H) 44   Uric acid 9.9 Urine PC ratio 7610   ASSESSMENT: 1. 33.4 week IUP with severe preeclampsia/HELLP syndrome 2. Unknown GBS status 3. History of Penicillin anaphylaxis allergy; vancomycin intolerance, clindamycin intolerance during current induction of labor 4. Status post betamethasone 2 doses  PLAN: 1. [facility] delivery 2. Continue magnesium sulfate prophylaxis, Foley catheter en route  Herold HarmsMartin A Shelli Portilla, MD Evern CoreFACOG

## 2016-06-10 DIAGNOSIS — O1410 Severe pre-eclampsia, unspecified trimester: Secondary | ICD-10-CM

## 2016-06-13 ENCOUNTER — Other Ambulatory Visit: Payer: 59

## 2016-06-13 ENCOUNTER — Encounter: Payer: 59 | Admitting: Obstetrics and Gynecology

## 2016-06-19 ENCOUNTER — Encounter: Payer: Self-pay | Admitting: Surgery

## 2016-06-19 ENCOUNTER — Ambulatory Visit (INDEPENDENT_AMBULATORY_CARE_PROVIDER_SITE_OTHER): Payer: 59 | Admitting: Surgery

## 2016-06-19 VITALS — BP 152/93 | HR 102 | Temp 99.5°F | Ht 68.0 in | Wt 201.0 lb

## 2016-06-19 DIAGNOSIS — L0501 Pilonidal cyst with abscess: Secondary | ICD-10-CM | POA: Diagnosis not present

## 2016-06-19 MED ORDER — DOXYCYCLINE HYCLATE 100 MG PO TBEC: 100.0000 mg | DELAYED_RELEASE_TABLET | Freq: Two times a day (BID) | ORAL | 0 refills | Status: DC

## 2016-06-19 MED ORDER — OXYCODONE-ACETAMINOPHEN 5-325 MG PO TABS: 1.0000 | ORAL_TABLET | ORAL | 0 refills | Status: DC | PRN

## 2016-06-19 MED ORDER — SULFAMETHOXAZOLE-TRIMETHOPRIM 800-160 MG PO TABS: 1.0000 | ORAL_TABLET | Freq: Two times a day (BID) | ORAL | 0 refills | Status: DC

## 2016-06-19 NOTE — Patient Instructions (Signed)
We need for you to pack the wound daily and change the dressing twice a day or as needed. We would like to see you back in 2 weeks. Please see the appointment listed below.Incision and Drainage of a Pilonidal Cyst, Care After Introduction Refer to this sheet in the next few weeks. These instructions provide you with information on caring for yourself after your procedure. Your health care provider may also give you more specific instructions. Your treatment has been planned according to current medical practices, but problems sometimes occur. Call your health care provider if you have any problems or questions after your procedure. What can I expect after the procedure? After your procedure, it is typical to have the following:  Pain near or at the surgical area.  Blood-tinged discharge on your wound packing or your bandage (dressing). Follow these instructions at home:  Take medicines only as directed by your health care provider.  If you were prescribed an antibiotic medicine, finish it all even if you start to feel better.  To prevent constipation:  Drink enough fluid to keep your urine clear or pale yellow.  Include lots of whole grains, fruits, and vegetables in your diet.  Do not do activities that irritate or put pressure on your buttocks for about 2 weeks or as directed by your health care provider. These include bike riding, running, and anything that involves a twisting motion.  Do not sit for long periods of time.  Sleep on your side instead of your back.  Ask your health care provider when you can return to work and resume your usual activities.  Wear loose, cotton underwear.  Keep all follow-up visits as directed by your health care provider. This is important. If you had a surgical cut (incision) and drainage with wound packing:  Return to your health care provider as instructed to have your packing changed or removed.  Keep the incision area dry until your packing  has been removed.  After the packing has been removed, you can start taking showers or baths.  Clean your buttocks area with soap and water.  Pat the area dry with a soft, clean towel. If you had a marsupialization procedure:  You can start taking showers or baths the day after surgery.  Let the water from the shower or bath moisten your dressing before you remove it.  After your shower or bath, pat your buttocks area dry with a soft, clean towel and replace your dressing.  Ask your health care provider:  When you can stop using a dressing.  When you can start taking showers or baths. If you had a surgical cut (incision) and drainage without packing:  Follow instructions from your health care provider about how to take care of your incision. Make sure you:  Wash your hands with soap and water before you change your bandage (dressing). If soap and water are not available, use hand sanitizer.  Change your dressing as told by your health care provider.  Leave stitches (sutures), skin glue, or adhesive strips in place. These skin closures may need to stay in place for 2 weeks or longer. If adhesive strip edges start to loosen and curl up, you may trim the loose edges. Do not remove adhesive strips completely unless your health care provider tells you to do that. Contact a health care provider if:  Your incision is bleeding.  You have signs of infection at your incision or around the incision. Watch for:  Drainage.  Redness.  Swelling.  Pain.  There is a bad smell coming from your incision site.  Your pain medicine is not helping.  You have a fever or chills.  You have muscles aches.  You are dizzy.  You feel generally ill. This information is not intended to replace advice given to you by your health care provider. Make sure you discuss any questions you have with your health care provider. Document Released: 08/15/2006 Document Revised: 12/21/2015 Document Reviewed:  12/02/2013  2017 Elsevier

## 2016-06-19 NOTE — Progress Notes (Signed)
Patient ID: Shelby Long, female   DOB: 1996/02/20, 20 y.o.   MRN: [id number]  HPI Shelby Long is a 20 y.o. female with about 3 day hx of severe low sacral and perianal pain, sharp and constant. Pain is worsened when she applies pressure. She did go to her PCP and small I/D was performed. Significant drainage and she did feel some relief. No packing was placed. She is on omicef. She is 10 days ot from a C section and had severe pre eclampsia during pregnancy.  She reports some chills.  HPI  Past Medical History:  Diagnosis Date  . Medical history non-contributory   . PONV (postoperative nausea and vomiting)     Past Surgical History:  Procedure Laterality Date  . WISDOM TOOTH EXTRACTION  2015    Family History  Problem Relation Age of Onset  . Sjogren's syndrome Mother   . Breast cancer Maternal Grandmother   . Parkinson's disease Maternal Grandfather     Social History Social History  Substance Use Topics  . Smoking status: Never Smoker  . Smokeless tobacco: Never Used  . Alcohol use No    Allergies  Allergen Reactions  . Sulfa Antibiotics Anaphylaxis  . Clindamycin/Lincomycin Swelling    Swelling to patient's tongue and lips. Patient later stated she had trouble breathing.   Marland Kitchen. Penicillins Swelling  . Vancomycin Swelling    Current Outpatient Prescriptions  Medication Sig Dispense Refill  . oxyCODONE-acetaminophen (ROXICET) 5-325 MG tablet Take 1 tablet by mouth every 4 (four) hours as needed for severe pain. 6 tablet 0  . sulfamethoxazole-trimethoprim (BACTRIM DS,SEPTRA DS) 800-160 MG tablet Take 1 tablet by mouth 2 (two) times daily. 28 tablet 0   No current facility-administered medications for this visit.      Review of Systems A 10 point review of systems was asked and was negative except for the information on the HPI  Physical Exam Blood pressure (!) 152/93, pulse (!) 102, temperature 99.5 F (37.5 C), temperature source Oral, height 5\' 8"   (1.727 m), weight 91.2 kg (201 lb), currently breastfeeding. CONSTITUTIONAL: NAD EYES: Pupils are equal, round, and reactive to light, Sclera are non-icteric. EARS, NOSE, MOUTH AND THROAT: The oropharynx is clear. The oral mucosa is pink and moist. Hearing is intact to voice. LYMPH NODES:  Lymph nodes in the neck are normal. RESPIRATORY:  Lungs are clear. There is normal respiratory effort, with equal breath sounds bilaterally, and without pathologic use of accessory muscles. CARDIOVASCULAR: Heart is regular without murmurs, gallops, or rubs. GI: The abdomen is  soft, nontender, and nondistended.C section healing well, no infection. GU: There is a pinpoint hole and with significant seropurulent drainage. There is significant erythema and tenderness to palpation. There is no evidence of necrotizing infection   MUSCULOSKELETAL: Normal muscle strength and tone. No cyanosis or edema.   SKIN: Turgor is good and there are no pathologic skin lesions or ulcers. NEUROLOGIC: Motor and sensation is grossly normal. Cranial nerves are grossly intact. PSYCH:  Oriented to person, place and time. Affect is normal.  Data Reviewed I have personally reviewed the patient's imaging, laboratory findings and medical records.    Assessment/Plan partially draining out of abscess. Discussed with the patient in detail about the need for further reexcision and drainage of the abscess. Procedure discussed with the patient in detail. Risk, benefits and possible complications. She understands and wishes to proceed we'll also add doxycycline as an adjuvant antibiotic.   Procedure note  Procedure: I/D complex pilonidal  abscess  Anesthesia: lidocaine 1% w epi  Findings: complex pilonidal abscess  Complications: none  Informed consent was obtained and she was playing in a prone position. She was prepped and draped in the standard fashion. After local infiltrated using a 11 blade knife we excise an elliptical area and  drained purulent fluid, loculation were broken down. 1/4 inch packing placed. No complications.   Sterling Bigiego Beryle Bagsby, MD FACS General Surgeon 06/19/2016, 12:30 PM

## 2016-06-26 ENCOUNTER — Other Ambulatory Visit: Payer: Self-pay

## 2016-06-26 ENCOUNTER — Encounter: Payer: Self-pay | Admitting: Surgery

## 2016-06-26 ENCOUNTER — Ambulatory Visit (INDEPENDENT_AMBULATORY_CARE_PROVIDER_SITE_OTHER): Payer: 59 | Admitting: Surgery

## 2016-06-26 VITALS — BP 141/88 | HR 102 | Temp 98.6°F | Wt 176.0 lb

## 2016-06-26 DIAGNOSIS — L0501 Pilonidal cyst with abscess: Secondary | ICD-10-CM

## 2016-06-26 NOTE — Patient Instructions (Signed)
Please call us if you have any questions or concerns. 

## 2016-06-26 NOTE — Progress Notes (Signed)
Outpatient postop visit  06/26/2016  Shelby Long is an 20 y.o. female.    Procedure: I&D of a pilonidal abscess  CC: Minimal pain  HPI: I spoke to the patient over the weekend by telephone who had had an I&D by Dr. Everlene FarrierPabon. She had packing in place and was unclear as to the instructions. She is also unclear about which antibiotics to fill. The patient removed packing a small amount of time over the weekend and the packing is now gone. She feels much better than she did previously and is happy with the results. Medications reviewed.    Physical Exam:  BP (!) 141/88   Pulse (!) 102   Temp 98.6 F (37 C) (Oral)   Wt 176 lb (79.8 kg)   BMI 26.76 kg/m     PE: Granulating open wound in the pilonidal area    Assessment/Plan:  Patient doing very well she has no erythema or drainage at this point I would recommend follow-up in 2 weeks with Dr. Everlene FarrierPabon. She will require ultimately resection to decrease her risk of recurrence.  Lattie Hawichard E Marlana Mckowen, MD, FACS

## 2016-07-08 ENCOUNTER — Encounter: Payer: Self-pay | Admitting: Surgery

## 2016-07-10 ENCOUNTER — Encounter: Payer: Self-pay | Admitting: Surgery

## 2016-07-11 ENCOUNTER — Encounter: Payer: Self-pay | Admitting: Surgery

## 2016-07-18 ENCOUNTER — Encounter: Payer: Self-pay | Admitting: Surgery

## 2016-07-19 NOTE — Progress Notes (Deleted)
   OBSTETRICS POSTPARTUM CLINIC PROGRESS NOTE  Subjective:     Shelby Long is a 20 y.o. G1P0 female who presents for a postpartum visit. She is 6 weeks postpartum following a STAT c/s due to concern for pulmonary embolism and HELLP syndrom. Patient was delivered at [facility] by Dr. Golden PopElizabeth Linvingston. I have fully reviewed the prenatal and intrapartum course. The delivery was at 33 gestational weeks.  Anesthesia: spinal. Postpartum course has been complicated by a pilonidal abscess which was treated by BSA. Baby's course has been ***. Baby is feeding by breast. Bleeding: patient has/has not resumed menses, with No LMP recorded. Patient is not currently having periods (Reason: Lactating).. Bowel function is normal. Bladder function is normal. Patient {is/is not:9024} sexually active. Contraception method desired is {contraceptive method:5051}. Postpartum depression screening: {neg default:13464::"negative"}.  {Common ambulatory SmartLinks:19316}  Review of Systems {ros; complete:30496}   Objective:    There were no vitals taken for this visit.  General:  alert and no distress   Breasts:  inspection negative, no nipple discharge or bleeding, no masses or nodularity palpable  Lungs: clear to auscultation bilaterally  Heart:  regular rate and rhythm, S1, S2 normal, no murmur, click, rub or gallop  Abdomen: soft, non-tender; bowel sounds normal; no masses,  no organomegaly.  ***Well healed Pfannenstiel incision   Vulva:  normal  Vagina: normal vagina, no discharge, exudate, lesion, or erythema  Cervix:  no cervical motion tenderness and no lesions  Corpus: normal size, contour, position, consistency, mobility, non-tender  Adnexa:  normal adnexa and no mass, fullness, tenderness  Rectal Exam: Not performed.         Labs:  Lab Results  Component Value Date   HGB 13.3 06/09/2016     Assessment:    *** postpartum exam.   @DIAGNOSES @   Plan:    1. Contraception: {method:5051} 2.  Will check Hgb for h/o anemia.  3. Follow up in: {1-10:13787} {time; units:19136} or as needed.    Debbe Baleskinawa Jamaya Sleeth, CMA [facility]

## 2016-07-24 ENCOUNTER — Encounter: Payer: 59 | Admitting: Obstetrics and Gynecology

## 2016-08-02 ENCOUNTER — Ambulatory Visit (INDEPENDENT_AMBULATORY_CARE_PROVIDER_SITE_OTHER): Payer: 59 | Admitting: Certified Nurse Midwife

## 2016-08-02 ENCOUNTER — Encounter: Payer: Self-pay | Admitting: Certified Nurse Midwife

## 2016-08-02 VITALS — BP 127/80 | HR 114 | Ht 68.0 in | Wt 172.4 lb

## 2016-08-02 DIAGNOSIS — L02234 Carbuncle of groin: Secondary | ICD-10-CM

## 2016-08-02 MED ORDER — CEFDINIR 300 MG PO CAPS: 300.0000 mg | ORAL_CAPSULE | Freq: Two times a day (BID) | ORAL | 0 refills | Status: AC

## 2016-08-02 NOTE — Patient Instructions (Signed)
Hidradenitis Suppurativa Introduction Hidradenitis suppurativa is a long-term (chronic) skin disease that starts with blocked sweat glands or hair follicles. Bacteria may grow in these blocked openings of your skin. Hidradenitis suppurativa is like a severe form of acne that develops in areas of your body where acne would be unusual. It is most likely to affect the areas of your body where skin rubs against skin and becomes moist. This includes your:  Underarms.  Groin.  Genital areas.  Buttocks.  Upper thighs.  Breasts. Hidradenitis suppurativa may start out with small pimples. The pimples can develop into deep sores that break open (rupture) and drain pus. Over time your skin may thicken and become scarred. Hidradenitis suppurativa cannot be passed from person to person. What are the causes? The exact cause of hidradenitis suppurativa is not known. This condition may be due to:  Female and female hormones. The condition is rare before and after puberty.  An overactive body defense system (immune system). Your immune system may overreact to the blocked hair follicles or sweat glands and cause swelling and pus-filled sores. What increases the risk? You may have a higher risk of hidradenitis suppurativa if you:  Are a woman.  Are between ages 11 and 55.  Have a family history of hidradenitis suppurativa.  Have a personal history of acne.  Are overweight.  Smoke.  Take the drug lithium. What are the signs or symptoms? The first signs of an outbreak are usually painful skin bumps that look like pimples. As the condition progresses:  Skin bumps may get bigger and grow deeper into the skin.  Bumps under the skin may rupture and drain smelly pus.  Skin may become itchy and infected.  Skin may thicken and scar.  Drainage may continue through tunnels under the skin (fistulas).  Walking and moving your arms can become painful. How is this diagnosed? Your health care provider  may diagnose hidradenitis suppurativa based on your medical history and your signs and symptoms. A physical exam will also be done. You may need to see a health care provider who specializes in skin diseases (dermatologist). You may also have tests done to confirm the diagnosis. These can include:  Swabbing a sample of pus or drainage from your skin so it can be sent to the lab and tested for infection.  Blood tests to check for infection. How is this treated? The same treatment will not work for everybody with hidradenitis suppurativa. Your treatment will depend on how severe your symptoms are. You may need to try several treatments to find what works best for you. Part of your treatment may include cleaning and bandaging (dressing) your wounds. You may also have to take medicines, such as the following:  Antibiotics.  Acne medicines.  Medicines to block or suppress the immune system.  A diabetes medicine (metformin) is sometimes used to treat this condition.  For women, birth control pills can sometimes help relieve symptoms. You may need surgery if you have a severe case of hidradenitis suppurativa that does not respond to medicine. Surgery may involve:  Using a laser to clear the skin and remove hair follicles.  Opening and draining deep sores.  Removing the areas of skin that are diseased and scarred. Follow these instructions at home:  Learn as much as you can about your disease, and work closely with your health care providers.  Take medicines only as directed by your health care provider.  If you were prescribed an antibiotic medicine, finish it all   even if you start to feel better.  If you are overweight, losing weight may be very helpful. Try to reach and maintain a healthy weight.  Do not use any tobacco products, including cigarettes, chewing tobacco, or electronic cigarettes. If you need help quitting, ask your health care provider.  Do not shave the areas where you  get hidradenitis suppurativa.  Do not wear deodorant.  Wear loose-fitting clothes.  Try not to overheat and get sweaty.  Take a daily bleach bath as directed by your health care provider.  Fill your bathtub halfway with water.  Pour in  cup of unscented household bleach.  Soak for 5-10 minutes.  Cover sore areas with a warm, clean washcloth (compress) for 5-10 minutes. Contact a health care provider if:  You have a flare-up of hidradenitis suppurativa.  You have chills or a fever.  You are having trouble controlling your symptoms at home. This information is not intended to replace advice given to you by your health care provider. Make sure you discuss any questions you have with your health care provider. Document Released: 02/27/2004 Document Revised: 12/21/2015 Document Reviewed: 10/15/2013  2017 Elsevier  

## 2016-08-02 NOTE — Progress Notes (Signed)
Pt is here with c/o 50 cent sized lump in vagina that is red and very sore.

## 2016-08-03 NOTE — Progress Notes (Signed)
GYN ENCOUNTER NOTE  Subjective:       Shelby Long is a 21 y.o. G1P0 female is here for  evaluation of large left groin "boil".   Shelby Long reports a history of boils and cysts to her groin and buttocks.    Gynecologic History No LMP recorded. Patient is not currently having periods (Reason: Lactating).   Contraception: Not assessed   Last Pap: 10/26/2015. Results were: normal  Obstetric History OB History  Gravida Para Term Preterm AB Living  1            SAB TAB Ectopic Multiple Live Births               # Outcome Date GA Lbr Len/2nd Weight Sex Delivery Anes PTL Lv  1 Gravida               Past Medical History:  Diagnosis Date  . Medical history non-contributory   . PONV (postoperative nausea and vomiting)   . Preeclampsia     Past Surgical History:  Procedure Laterality Date  . CESAREAN SECTION    . CYST EXCISION N/A    on tailbone  . WISDOM TOOTH EXTRACTION  2015    No current outpatient prescriptions on file prior to visit.   No current facility-administered medications on file prior to visit.     Allergies  Allergen Reactions  . Sulfa Antibiotics Anaphylaxis  . Clindamycin/Lincomycin Swelling    Swelling to patient's tongue and lips. Patient later stated she had trouble breathing.   Marland Kitchen Penicillins Swelling  . Vancomycin Swelling    Social History   Social History  . Marital status: Single    Spouse name: N/A  . Number of children: N/A  . Years of education: N/A   Occupational History  . Not on file.   Social History Main Topics  . Smoking status: Never Smoker  . Smokeless tobacco: Never Used  . Alcohol use No  . Drug use: No  . Sexual activity: Yes   Other Topics Concern  . Not on file   Social History Narrative  . No narrative on file    Family History  Problem Relation Age of Onset  . Sjogren's syndrome Mother   . Breast cancer Maternal Grandmother   . Parkinson's disease Maternal Grandfather     The following portions  of the patient's history were reviewed and updated as appropriate: allergies, current medications, past family history, past medical history, past social history, past surgical history and problem list.  Review of Systems Review of Systems - History obtained from the patient General ROS: negative Respiratory ROS: no cough, shortness of breath, or wheezing Cardiovascular ROS: no chest pain or dyspnea on exertion Genito-Urinary ROS: positive for - large left groin boil Dermatological ROS: no other lesions or rash   Objective:   BP 127/80   Pulse (!) 114   Ht 5\' 8"  (1.727 m)   Wt 172 lb 7 oz (78.2 kg)   Breastfeeding? No   BMI 26.22 kg/m   CONSTITUTIONAL: Well-developed, well-nourished female in no acute distress.   SKIN: Skin is warm and dry. Incision healed, well approximated. No rash noted. Not diaphoretic. No erythema. No pallor.  ABDOMEN: Soft, non distended; Non tender.  No Organomegaly.  PELVIC: quarter sized red, tender carbuncle palpable in left groin, no drainage noted. No lymphopathy noted.   Assessment:   1. Carbuncle of groin   Plan:   1. Continue home care measures like warm compress and tub  soaks.   2. Techniques discussed for management of spontaneous wound drainage  3. Due to several medication allergies and intolerances and pt report of previously tolerating Omnicef  Rx Omnicef 300 mg, 1 tablet PO BID x 10 days  4. RTC if symptoms worsen or do not improve   Gunnar BullaJenkins Michelle Sumeya Yontz, CNM

## 2016-08-06 ENCOUNTER — Ambulatory Visit (INDEPENDENT_AMBULATORY_CARE_PROVIDER_SITE_OTHER): Payer: 59 | Admitting: Certified Nurse Midwife

## 2016-08-06 ENCOUNTER — Encounter: Payer: Self-pay | Admitting: Certified Nurse Midwife

## 2016-08-06 MED ORDER — NORETHINDRONE ACET-ETHINYL EST 1-20 MG-MCG PO TABS: 1.0000 | ORAL_TABLET | Freq: Every day | ORAL | 4 refills | Status: DC

## 2016-08-06 NOTE — Progress Notes (Signed)
Subjective:    Shelby Long is a 21 y.o. G1P0 Caucasian female who presents for a postpartum visit. She is 8 weeks postpartum following a primary cesarean section, low transverse incision and cesarean indication: failure to progress: arrest of dilation and HELLP Syndrome at 32+6 gestational weeks. Anesthesia: general. I have fully reviewed the prenatal and intrapartum course.  Postpartum course has been uncomplicated except care for carbuncle and furuncles of the buttocks and groin. Baby's course has been complicated by a 15 day NICU stay, but otherwise she (Shelby Long) is doing well. Baby is feeding by bottle - Enfacare.   Her menstrual cycle has resumed. Bowel function is normal. LBM: 08/05/2016. Bladder function is normal.   Patient is sexually active. Last sexual activity: 08/05/2016. Contraception method is condoms. Postpartum depression screening: negative. Score 0. Denies sucidal and homicidal ideations Last pap 09/2015 and was normal.  The following portions of the patient's history were reviewed and updated as appropriate: allergies, current medications, past medical history, past surgical history and problem list.  Review of Systems Pertinent items are noted in HPI.   Vitals:   08/06/16 1342  BP: 125/76  Pulse: 91  Weight: 172 lb 8 oz (78.2 kg)  Height: 5\' 7"  (1.702 m)   Patient's last menstrual period was 08/06/2016 (exact date).  Objective:   General:  Alert, cooperative and no distress   Breasts:  Deferred, no complaints  Lungs: Clear to auscultation bilaterally  Heart:  Regular rate and rhythm  Abdomen: Soft, non-tender; incision healed-well appoximated   Pelvic exam:  Deferred, no complaints        Assessment:   Postpartum exam Eight wks s/p cesarean section Bottlefeeding Depression screening Contraception counseling   Plan:   1. Education given regarding options for contraception, including oral contraceptives. See orders  2. Lab: H/H  3. Follow up in: 2  months for Annual Exam or earlier if needed   Gunnar BullaJenkins Michelle Bertine Long, CNM

## 2016-08-06 NOTE — Progress Notes (Signed)
Pt is here for a post partum visit. LMP 08/06/16,07/06/16. Is interested in starting OCPs. Is bottle feeding. Has resumed intercourse with no problems. Pt states she had preeclampsia.

## 2016-08-07 LAB — HEMOGLOBIN AND HEMATOCRIT, BLOOD
HEMOGLOBIN: 10.5 g/dL — AB (ref 11.1–15.9)
Hematocrit: 30.8 % — ABNORMAL LOW (ref 34.0–46.6)

## 2016-08-08 ENCOUNTER — Telehealth: Payer: Self-pay | Admitting: Certified Nurse Midwife

## 2016-08-08 MED ORDER — FUSION PLUS PO CAPS: 1.0000 | ORAL_CAPSULE | Freq: Every day | ORAL | 1 refills | Status: DC

## 2016-08-08 NOTE — Telephone Encounter (Signed)
Called pt, verified full name and date of birth.   H/H results given. Rx Fusion Plus, 1 capsule daily PO Monday-Friday. Return to clinic for recheck in three months.   See orders.    Gunnar BullaJenkins Michelle Mahlani Berninger, CNM

## 2016-08-19 ENCOUNTER — Encounter: Payer: Self-pay | Admitting: Certified Nurse Midwife

## 2016-11-21 ENCOUNTER — Other Ambulatory Visit: Payer: Self-pay | Admitting: Internal Medicine

## 2016-11-21 DIAGNOSIS — R1013 Epigastric pain: Secondary | ICD-10-CM

## 2016-12-19 ENCOUNTER — Other Ambulatory Visit: Payer: Self-pay | Admitting: Internal Medicine

## 2016-12-19 DIAGNOSIS — R1013 Epigastric pain: Secondary | ICD-10-CM

## 2016-12-25 ENCOUNTER — Ambulatory Visit
Admission: RE | Admit: 2016-12-25 | Discharge: 2016-12-25 | Disposition: A | Discharge status: Home / Self Care | Payer: 59 | Source: Ambulatory Visit | Attending: Internal Medicine | Admitting: Internal Medicine

## 2016-12-25 DIAGNOSIS — R1013 Epigastric pain: Secondary | ICD-10-CM | POA: Insufficient documentation

## 2017-02-04 ENCOUNTER — Ambulatory Visit (INDEPENDENT_AMBULATORY_CARE_PROVIDER_SITE_OTHER): Payer: 59 | Admitting: Certified Nurse Midwife

## 2017-02-04 ENCOUNTER — Encounter: Payer: Self-pay | Admitting: Certified Nurse Midwife

## 2017-02-04 VITALS — BP 113/76 | HR 78 | Ht 67.0 in | Wt 174.0 lb

## 2017-02-04 DIAGNOSIS — R102 Pelvic and perineal pain: Secondary | ICD-10-CM | POA: Diagnosis not present

## 2017-02-04 DIAGNOSIS — Z01419 Encounter for gynecological examination (general) (routine) without abnormal findings: Secondary | ICD-10-CM | POA: Diagnosis not present

## 2017-02-04 DIAGNOSIS — Z803 Family history of malignant neoplasm of breast: Secondary | ICD-10-CM

## 2017-02-04 DIAGNOSIS — N941 Unspecified dyspareunia: Secondary | ICD-10-CM | POA: Diagnosis not present

## 2017-02-04 DIAGNOSIS — Z6827 Body mass index (BMI) 27.0-27.9, adult: Secondary | ICD-10-CM

## 2017-02-04 DIAGNOSIS — R5383 Other fatigue: Secondary | ICD-10-CM

## 2017-02-04 NOTE — Patient Instructions (Signed)
Preventive Care 18-39 Years, Female Preventive care refers to lifestyle choices and visits with your health care provider that can promote health and wellness. What does preventive care include?  A yearly physical exam. This is also called an annual well check.  Dental exams once or twice a year.  Routine eye exams. Ask your health care provider how often you should have your eyes checked.  Personal lifestyle choices, including: ? Daily care of your teeth and gums. ? Regular physical activity. ? Eating a healthy diet. ? Avoiding tobacco and drug use. ? Limiting alcohol use. ? Practicing safe sex. ? Taking vitamin and mineral supplements as recommended by your health care provider. What happens during an annual well check? The services and screenings done by your health care provider during your annual well check will depend on your age, overall health, lifestyle risk factors, and family history of disease. Counseling Your health care provider may ask you questions about your:  Alcohol use.  Tobacco use.  Drug use.  Emotional well-being.  Home and relationship well-being.  Sexual activity.  Eating habits.  Work and work Statistician.  Method of birth control.  Menstrual cycle.  Pregnancy history.  Screening You may have the following tests or measurements:  Height, weight, and BMI.  Diabetes screening. This is done by checking your blood sugar (glucose) after you have not eaten for a while (fasting).  Blood pressure.  Lipid and cholesterol levels. These may be checked every 5 years starting at age 66.  Skin check.  Hepatitis C blood test.  Hepatitis B blood test.  Sexually transmitted disease (STD) testing.  BRCA-related cancer screening. This may be done if you have a family history of breast, ovarian, tubal, or peritoneal cancers.  Pelvic exam and Pap test. This may be done every 3 years starting at age 40. Starting at age 59, this may be done every 5  years if you have a Pap test in combination with an HPV test.  Discuss your test results, treatment options, and if necessary, the need for more tests with your health care provider. Vaccines Your health care provider may recommend certain vaccines, such as:  Influenza vaccine. This is recommended every year.  Tetanus, diphtheria, and acellular pertussis (Tdap, Td) vaccine. You may need a Td booster every 10 years.  Varicella vaccine. You may need this if you have not been vaccinated.  HPV vaccine. If you are 69 or younger, you may need three doses over 6 months.  Measles, mumps, and rubella (MMR) vaccine. You may need at least one dose of MMR. You may also need a second dose.  Pneumococcal 13-valent conjugate (PCV13) vaccine. You may need this if you have certain conditions and were not previously vaccinated.  Pneumococcal polysaccharide (PPSV23) vaccine. You may need one or two doses if you smoke cigarettes or if you have certain conditions.  Meningococcal vaccine. One dose is recommended if you are age 27-21 years and a first-year college student living in a residence hall, or if you have one of several medical conditions. You may also need additional booster doses.  Hepatitis A vaccine. You may need this if you have certain conditions or if you travel or work in places where you may be exposed to hepatitis A.  Hepatitis B vaccine. You may need this if you have certain conditions or if you travel or work in places where you may be exposed to hepatitis B.  Haemophilus influenzae type b (Hib) vaccine. You may need this if  you have certain risk factors.  Talk to your health care provider about which screenings and vaccines you need and how often you need them. This information is not intended to replace advice given to you by your health care provider. Make sure you discuss any questions you have with your health care provider. Document Released: 09/10/2001 Document Revised: 04/03/2016  Document Reviewed: 05/16/2015 Elsevier Interactive Patient Education  2017 [employer].

## 2017-02-04 NOTE — Progress Notes (Signed)
ANNUAL PREVENTATIVE CARE GYN  ENCOUNTER NOTE  Subjective:       Shelby Long is a 21 y.o. G1P0 female here for a routine annual gynecologic exam.  Current complaints: fatigue, pelvic pain, and dyspareunia since her c-section on 06/09/2016.   She also reports lower abdominal fullness and bloating.   Denies difficulty breathing or respiratory distress, chest pain, abdominal pain, vaginal bleeding, dysuria, and leg pain or swelling.   Pt's mother is currently in pelvic floor therapy and patient questions the need for referral.    Gynecologic History  Patient's last menstrual period was 08/06/2016.   Contraception: OCP (estrogen/progesterone)   Last Pap: 10/26/2015. Results were: normal  Obstetric History  OB History  Gravida Para Term Preterm AB Living  1 1   1   1   SAB TAB Ectopic Multiple Live Births          1    # Outcome Date GA Lbr Len/2nd Weight Sex Delivery Anes PTL Lv  1 Preterm      CS-LTranv  N LIV     Complications: Failure to Progress in First Stage,Severe preeclampsia      Past Medical History:  Diagnosis Date  . Medical history non-contributory   . PONV (postoperative nausea and vomiting)   . Preeclampsia     Past Surgical History:  Procedure Laterality Date  . CESAREAN SECTION    . CYST EXCISION N/A    on tailbone  . WISDOM TOOTH EXTRACTION  2015    Current Outpatient Prescriptions on File Prior to Visit  Medication Sig Dispense Refill  . norethindrone-ethinyl estradiol (LOESTRIN 1/20, 21,) 1-20 MG-MCG tablet Take 1 tablet by mouth daily. 3 Package 4   No current facility-administered medications on file prior to visit.     Allergies  Allergen Reactions  . Shellfish-Derived Products Anaphylaxis  . Sulfa Antibiotics Anaphylaxis and Swelling  . Clindamycin/Lincomycin Swelling    Swelling to patient's tongue and lips. Patient later stated she had trouble breathing.   Marland Kitchen. Penicillins Swelling  . Vancomycin Swelling    Social History    Social History  . Marital status: Single    Spouse name: N/A  . Number of children: N/A  . Years of education: N/A   Occupational History  . Not on file.   Social History Main Topics  . Smoking status: Never Smoker  . Smokeless tobacco: Never Used  . Alcohol use Yes     Comment: occasional  . Drug use: No  . Sexual activity: Yes    Birth control/ protection: None   Other Topics Concern  . Not on file   Social History Narrative  . No narrative on file    Family History  Problem Relation Age of Onset  . Sjogren's syndrome Mother   . Breast cancer Maternal Grandmother   . Parkinson's disease Maternal Grandfather     The following portions of the patient's history were reviewed and updated as appropriate: allergies, current medications, past family history, past medical history, past social history, past surgical history and problem list.  Review of Systems ROS negative except as noted above. Information obtained from patient.   Objective:   BP 113/76   Pulse 78   Ht 5\' 7"  (1.702 m)   Wt 174 lb (78.9 kg)   BMI 27.25 kg/m   CONSTITUTIONAL: Well-developed, well-nourished female in no acute distress.   PSYCHIATRIC: Normal mood and affect. Normal behavior. Normal judgment and thought content.  NEUROLGIC: Alert and oriented to person,  place, and time. Normal muscle tone coordination. No cranial nerve deficit noted.  HENT:  Normocephalic, atraumatic, External right and left  ear normal. Oropharynx is clear and moist  EYES: Conjunctivae and EOM are normal. Pupils are equal,  round, and reactive to light. No scleral icterus.   NECK: Normal range of motion, supple, no masses.  Normal thyroid.   SKIN: Skin is warm and dry. No rash noted. Not diaphoretic. No erythema. No pallor.  CARDIOVASCULAR: Normal heart rate noted, regular rhythm, no murmur.  RESPIRATORY: Clear to auscultation bilaterally. Effort and breath sounds normal, no problems with respiration  noted.  BREASTS: Symmetric in size. No masses, skin changes, nipple drainage, or lymphadenopathy.  ABDOMEN: Soft, normal bowel sounds, no distention noted.  No tenderness, rebound or guarding.   PELVIC:  External Genitalia: Normal  Vagina: Normal  Cervix: Normal  Uterus: Normal  Adnexa: Normal  MUSCULOSKELETAL: Normal range of motion. No tenderness.  No cyanosis, clubbing, or edema.  2+ distal pulses.  LYMPHATIC: No Axillary, Supraclavicular, or Inguinal Adenopathy.  Assessment:   Annual gynecologic examination 21 y.o.   Contraception: OCP (estrogen/progesterone)   Overweight   Problem List Items Addressed This Visit    None    Visit Diagnoses    Dyspareunia in female    -  Primary   Encounter for gynecological examination       Relevant Orders   Thyroid Panel With TSH   BMI 27.0-27.9,adult       Relevant Orders   Thyroid Panel With TSH   Family history of breast cancer       Pelvic pain in female       Relevant Orders   NuSwab Vaginitis Plus (VG+)   Fatigue, unspecified type       Relevant Orders   Thyroid Panel With TSH      Plan:   Pap: Not done  Labs: NuSwab and TSH, will contact pt via MyChart with results.    Routine preventative health maintenance measures emphasized: Exercise/Diet/Weight control, Tobacco Warnings, Alcohol/Substance use risks, Stress Management, Peer Pressure Issues and Safe Sex  RTC x 1 week for pelvic US, RTC x 2 weeks for results review.   RTC x 1 year for annual exam.    Gunnar Bulla, CNM

## 2017-02-05 LAB — THYROID PANEL WITH TSH
FREE THYROXINE INDEX: 2.6 (ref 1.2–4.9)
T3 Uptake Ratio: 26 % (ref 24–39)
T4 TOTAL: 10 ug/dL (ref 4.5–12.0)
TSH: 2.48 u[IU]/mL (ref 0.450–4.500)

## 2017-02-08 LAB — NUSWAB VAGINITIS PLUS (VG+)
CANDIDA ALBICANS, NAA: NEGATIVE
CHLAMYDIA TRACHOMATIS, NAA: NEGATIVE
Candida glabrata, NAA: NEGATIVE
NEISSERIA GONORRHOEAE, NAA: NEGATIVE
TRICH VAG BY NAA: NEGATIVE

## 2017-02-11 ENCOUNTER — Other Ambulatory Visit: Payer: Self-pay | Admitting: Certified Nurse Midwife

## 2017-02-11 DIAGNOSIS — N941 Unspecified dyspareunia: Secondary | ICD-10-CM

## 2017-02-11 DIAGNOSIS — R102 Pelvic and perineal pain: Secondary | ICD-10-CM

## 2017-02-12 ENCOUNTER — Ambulatory Visit (INDEPENDENT_AMBULATORY_CARE_PROVIDER_SITE_OTHER): Payer: 59

## 2017-02-12 DIAGNOSIS — R102 Pelvic and perineal pain: Secondary | ICD-10-CM

## 2017-02-12 DIAGNOSIS — N941 Unspecified dyspareunia: Secondary | ICD-10-CM | POA: Diagnosis not present

## 2017-02-18 ENCOUNTER — Encounter: Payer: 59 | Admitting: Certified Nurse Midwife

## 2017-02-20 ENCOUNTER — Ambulatory Visit (INDEPENDENT_AMBULATORY_CARE_PROVIDER_SITE_OTHER): Payer: 59 | Admitting: Certified Nurse Midwife

## 2017-02-20 ENCOUNTER — Encounter: Payer: Self-pay | Admitting: Certified Nurse Midwife

## 2017-02-20 VITALS — BP 126/82 | HR 81 | Wt 177.7 lb

## 2017-02-20 DIAGNOSIS — R102 Pelvic and perineal pain: Secondary | ICD-10-CM | POA: Diagnosis not present

## 2017-02-20 DIAGNOSIS — N941 Unspecified dyspareunia: Secondary | ICD-10-CM | POA: Diagnosis not present

## 2017-02-20 DIAGNOSIS — Z09 Encounter for follow-up examination after completed treatment for conditions other than malignant neoplasm: Secondary | ICD-10-CM | POA: Diagnosis not present

## 2017-02-20 NOTE — Progress Notes (Signed)
GYN ENCOUNTER NOTE  Subjective:       Shelby Long is a 21 y.o. 241P0101 female results review and follow up appointment.   She was seen by myself on  02/04/2017 for evaluation of dyspareunia, pelvic pain and fatigue since birth of infant daughter by c-section in November 2017.   Denies difficulty breathing or respiratory distress, chest pain, abdominal pain, excessive vaginal bleeding, dysuria,change in vaginal discharge, and leg pain or swelling.   PCP: Osborne OmanJeffery Sparks, MD. Scheduled for EGD 06/2017.   Gynecologic History  Patient's last menstrual period was 01/21/2017.  Contraception: OCP (estrogen/progesterone)  Last Pap: 10/26/2015. Results were: normal  Obstetric History  OB History  Gravida Para Term Preterm AB Living  1 1   1   1   SAB TAB Ectopic Multiple Live Births          1    # Outcome Date GA Lbr Len/2nd Weight Sex Delivery Anes PTL Lv  1 Preterm      CS-LTranv  N LIV     Complications: Failure to Progress in First Stage,Severe preeclampsia      Past Medical History:  Diagnosis Date  . Medical history non-contributory   . PONV (postoperative nausea and vomiting)   . Preeclampsia     Past Surgical History:  Procedure Laterality Date  . CESAREAN SECTION    . CYST EXCISION N/A    on tailbone  . WISDOM TOOTH EXTRACTION  2015    Current Outpatient Prescriptions on File Prior to Visit  Medication Sig Dispense Refill  . norethindrone-ethinyl estradiol (LOESTRIN 1/20, 21,) 1-20 MG-MCG tablet Take 1 tablet by mouth daily. 3 Package 4   No current facility-administered medications on file prior to visit.     Allergies  Allergen Reactions  . Shellfish-Derived Products Anaphylaxis  . Sulfa Antibiotics Anaphylaxis and Swelling  . Clindamycin/Lincomycin Swelling    Swelling to patient's tongue and lips. Patient later stated she had trouble breathing.   Marland Kitchen. Penicillins Swelling  . Vancomycin Swelling    Social History   Social History  . Marital  status: Single    Spouse name: N/A  . Number of children: N/A  . Years of education: N/A   Occupational History  . Not on file.   Social History Main Topics  . Smoking status: Never Smoker  . Smokeless tobacco: Never Used  . Alcohol use Yes     Comment: occasional  . Drug use: No  . Sexual activity: Yes    Birth control/ protection: None   Other Topics Concern  . Not on file   Social History Narrative  . No narrative on file    Family History  Problem Relation Age of Onset  . Sjogren's syndrome Mother   . Breast cancer Maternal Grandmother   . Parkinson's disease Maternal Grandfather     The following portions of the patient's history were reviewed and updated as appropriate: allergies, current medications, past family history, past medical history, past social history, past surgical history and problem list.  Review of Systems  Review of Systems - Negative except as noted above.  History obtained from the patient.   Objective:   BP 126/82 (BP Location: Left Arm, Patient Position: Sitting, Cuff Size: Normal)   Pulse 81   Wt 177 lb 11.2 oz (80.6 kg)   LMP 01/21/2017   BMI 27.83 kg/m   Alert and oriented x 4, no apparent distress.   Labs:   Ref Range & Units 2wk ago  TSH 0.450 - 4.500 uIU/mL 2.480   T4, Total 4.5 - 12.0 ug/dL 16.110.0   T3 Uptake Ratio 24 - 39 % 26   Free Thyroxine Index 1.2 - 4.9 2.6      Ref Range & Units 2wk ago  Atopobium vaginae Score Low - 0   BVAB 2 Score Low - 0   Megasphaera 1 Score Low - 0   Candida albicans, NAA Negative Negative   Candida glabrata, NAA Negative Negative   Trich vag by NAA Negative Negative   Chlamydia trachomatis, NAA Negative Negative   Neisseria gonorrhoeae, NAA Negative Negative         ULTRASOUND REPORT  Location: [facility] Date of Service: 02/12/17   Indications: Pelvic Pain and Dyspareunia since C-Section Findings:  The uterus is anteverted and measures 7.4 x 2.9 x 4.2 cm. Echo  texture is homogenous without evidence of focal masses.  The Endometrium measures 1.9 mm.  Right Ovary measures 3.3 x 1.9 x 2.0 cm, and appears WNL. Left Ovary measures 3.0 x 1.5 x 1.7 cm, and appears WNL. Survey of the adnexa demonstrates no adnexal masses. There is a trace of free fluid in the cul de sac.  Impression: 1. Normal appearing pelvic ultrasound.  Recommendations: 1.Clinical correlation with the pati  Assessment:   1. Dyspareunia in female   2. Pelvic pain   3. Follow up   Plan:   Results reviewed with pt, verbalized understanding.   Referral PT-Pelvic Floor Therapy, see orders.   Reviewed red flag symptoms and when to call.   RTC as needed.    Gunnar BullaJenkins Michelle Kary Colaizzi, CNM

## 2017-02-20 NOTE — Patient Instructions (Signed)
Pelvic Pain, Female Pelvic pain is pain in your lower abdomen, below your belly button and between your hips. The pain may start suddenly (acute), keep coming back (recurring), or last a long time (chronic). Pelvic pain that lasts longer than six months is considered chronic. Pelvic pain may affect your:  Reproductive organs.  Urinary system.  Digestive tract.  Musculoskeletal system.  There are many potential causes of pelvic pain. Sometimes, the pain can be a result of digestive or urinary conditions, strained muscles or ligaments, or even reproductive conditions. Sometimes the cause of pelvic pain is not known. Follow these instructions at home:  Take over-the-counter and prescription medicines only as told by your health care provider.  Rest as told by your health care provider.  Do not have sex it if hurts.  Keep a journal of your pelvic pain. Write down: ? When the pain started. ? Where the pain is located. ? What seems to make the pain better or worse, such as food or your menstrual cycle. ? Any symptoms you have along with the pain.  Keep all follow-up visits as told by your health care provider. This is important. Contact a health care provider if:  Medicine does not help your pain.  Your pain comes back.  You have new symptoms.  You have abnormal vaginal discharge or bleeding, including bleeding after menopause.  You have a fever or chills.  You are constipated.  You have blood in your urine or stool.  You have foul-smelling urine.  You feel weak or lightheaded. Get help right away if:  You have sudden severe pain.  Your pain gets steadily worse.  You have severe pain along with fever, nausea, vomiting, or excessive sweating.  You lose consciousness. This information is not intended to replace advice given to you by your health care provider. Make sure you discuss any questions you have with your health care provider. Document Released: 06/11/2004  Document Revised: 08/09/2015 Document Reviewed: 05/05/2015 Elsevier Interactive Patient Education  2018 Elsevier Inc.  

## 2017-03-11 ENCOUNTER — Ambulatory Visit: Payer: 59 | Attending: Certified Nurse Midwife | Admitting: Physical Therapy

## 2017-03-11 DIAGNOSIS — M533 Sacrococcygeal disorders, not elsewhere classified: Secondary | ICD-10-CM | POA: Insufficient documentation

## 2017-03-11 DIAGNOSIS — M6281 Muscle weakness (generalized): Secondary | ICD-10-CM | POA: Diagnosis present

## 2017-03-11 DIAGNOSIS — R2689 Other abnormalities of gait and mobility: Secondary | ICD-10-CM

## 2017-03-11 NOTE — Patient Instructions (Signed)
   Standing:  10 reps on both sides x 3 x day     3 point tap   Feet are hip width Tap forward, center under hip not feet next to each other  Tap middle, center  Tap back    _______  All fours, straightening R knee back, toes tucked   10 reps while playing with [name]    ______   Sidelying, swinging the leg back without arching back    ______   Sleep with pillow between knees

## 2017-03-12 NOTE — Therapy (Deleted)
[facility] MAIN [facility] SERVICES [address] [place], [place], [address] Phone: [phone]   Fax:  [phone]  Physical Therapy Evaluation  Patient Details  Name: Shelby Long MRN: [id number] Date of Birth: 11/21/1995 Referring Provider: Jeralyn Bennett   Encounter Date: 03/11/2017      PT End of Session - 03/12/17 1531    Visit Number 1   Number of Visits 12   Date for PT Re-Evaluation 07/22/17   PT Start Time 0910   PT Stop Time 1005   PT Time Calculation (min) 55 min      Past Medical History:  Diagnosis Date  . Medical history non-contributory   . PONV (postoperative nausea and vomiting)   . Preeclampsia     Past Surgical History:  Procedure Laterality Date  . CESAREAN SECTION    . CYST EXCISION N/A    on tailbone  . WISDOM TOOTH EXTRACTION  2015    There were no vitals filed for this visit.       Subjective Assessment - 03/11/17 0923    Subjective 1) pelvic pain started 1 month after her first pregnancy and delivery. Pt is 9 months post-partum. Pain occurs with functional activities, GYN/ OB exams, and wearing of tampons. Denied difficulty with bowel and urinary functions. Pt is not breastfeeding. Pt's dtr  weighs 15 lbs.  Denied radiating pain with pelvic pain.   2) R Hip pain with radiating pain to the outer side of knee. Pt compensates on the L leg which then also has radiating pain.  6-7/10. Occurs random times off and on. Pain occurs overnight . Pt sleeps on her side but when pain comes on, she sleeps on her stomach. 3) CLBP occured during pregnancy 2/2 kidney intfection which has cleared. This pain never went away. 5/10. Constant. no radiating pain. upper low back central.      Pertinent History Emergency C - section due pre-clampsia and Hellp Syndrome. Baby was 7 weeks premature, Pt was on a breathing tube 12 hours.    Patient Stated Goals Not hurt             [facility] PT Assessment - 03/11/17 0931      Assessment    Medical Diagnosis Dyspareunia    Referring Provider [place]      ROM / Strength   AROM / PROM / Strength --  WFL all spinal directions     Palpation   SI assessment  R ASIS more posterior/ superior than L, R malleoli higher than L, R SIJ limited mobility, referred to pelvic pain with palpation along lateral border of sacrum     Palpation comment Ischicavernosus R with tenderness             Objective measurements completed on examination: See above findings.                  PT Education - 03/11/17 1017    Education provided Yes   Education Details HEP, POC, anatomy/ physiology, goals,   Person(s) Educated Patient   Methods Explanation;Demonstration;Tactile cues;Verbal cues;Handout   Comprehension Returned demonstration;Verbalized understanding                     Plan - 03/12/17 1532    Clinical Impression Statement Pt   Clinical Presentation Evolving   Clinical Decision Making Moderate      Patient will benefit from skilled therapeutic intervention in order to improve the following deficits and impairments:  Hypomobility,  Increased muscle spasms, Postural dysfunction, Decreased endurance, Decreased range of motion, Decreased strength, Decreased mobility, Decreased coordination, Decreased scar mobility, Improper body mechanics, Pain  Visit Diagnosis: Sacrococcygeal disorders, not elsewhere classified  Muscle weakness (generalized)  Other abnormalities of gait and mobility     Problem List Patient Active Problem List   Diagnosis Date Noted  . Anxiety 06/02/2016  . History of depression 06/02/2016    Mariane MastersYeung,Shin Yiing ,PT, DPT, E-RYT  03/12/2017, 3:34 PM  [facility] [address] [place], [place], [address] Phone: [phone]   Fax:  [phone]  Name: Shelby Long MRN: [id number] Date of Birth: [birth year]

## 2017-03-17 ENCOUNTER — Ambulatory Visit
Admission: RE | Admit: 2017-03-17 | Discharge: 2017-03-17 | Disposition: A | Discharge status: Home / Self Care | Payer: 59 | Source: Ambulatory Visit | Attending: Gastroenterology | Admitting: Gastroenterology

## 2017-03-17 ENCOUNTER — Encounter
Admission: RE | Disposition: A | Discharge status: Home / Self Care | Payer: Self-pay | Source: Ambulatory Visit | Attending: Gastroenterology

## 2017-03-17 ENCOUNTER — Encounter: Payer: Self-pay | Admitting: *Deleted

## 2017-03-17 ENCOUNTER — Ambulatory Visit: Payer: 59 | Admitting: Anesthesiology

## 2017-03-17 DIAGNOSIS — Z882 Allergy status to sulfonamides status: Secondary | ICD-10-CM | POA: Diagnosis not present

## 2017-03-17 DIAGNOSIS — K3189 Other diseases of stomach and duodenum: Secondary | ICD-10-CM | POA: Diagnosis not present

## 2017-03-17 DIAGNOSIS — Z91013 Allergy to seafood: Secondary | ICD-10-CM | POA: Diagnosis not present

## 2017-03-17 DIAGNOSIS — K2970 Gastritis, unspecified, without bleeding: Secondary | ICD-10-CM | POA: Diagnosis not present

## 2017-03-17 DIAGNOSIS — Z880 Allergy status to penicillin: Secondary | ICD-10-CM | POA: Insufficient documentation

## 2017-03-17 DIAGNOSIS — Z881 Allergy status to other antibiotic agents status: Secondary | ICD-10-CM | POA: Insufficient documentation

## 2017-03-17 DIAGNOSIS — R1013 Epigastric pain: Secondary | ICD-10-CM | POA: Diagnosis present

## 2017-03-17 HISTORY — PX: ESOPHAGOGASTRODUODENOSCOPY (EGD) WITH PROPOFOL: SHX5813

## 2017-03-17 LAB — POCT PREGNANCY, URINE: PREG TEST UR: NEGATIVE

## 2017-03-17 SURGERY — ESOPHAGOGASTRODUODENOSCOPY (EGD) WITH PROPOFOL
Anesthesia: General

## 2017-03-17 MED ORDER — ONDANSETRON HCL 4 MG/2ML IJ SOLN
INTRAMUSCULAR | Status: AC
  Filled 2017-03-17: qty 2

## 2017-03-17 MED ORDER — LIDOCAINE HCL (CARDIAC) 20 MG/ML IV SOLN
INTRAVENOUS | Status: DC | PRN
  Administered 2017-03-17: 100 mg via INTRAVENOUS

## 2017-03-17 MED ORDER — ONDANSETRON HCL 4 MG/2ML IJ SOLN
INTRAMUSCULAR | Status: DC | PRN
  Administered 2017-03-17: 4 mg via INTRAVENOUS

## 2017-03-17 MED ORDER — SODIUM CHLORIDE 0.9 % IV SOLN
INTRAVENOUS | Status: DC
  Administered 2017-03-17: 09:00:00 via INTRAVENOUS

## 2017-03-17 MED ORDER — SODIUM CHLORIDE 0.9 % IV SOLN: INTRAVENOUS | Status: DC

## 2017-03-17 MED ORDER — PROPOFOL 500 MG/50ML IV EMUL
INTRAVENOUS | Status: DC | PRN
  Administered 2017-03-17: 200 ug/kg/min via INTRAVENOUS

## 2017-03-17 MED ORDER — PROPOFOL 10 MG/ML IV BOLUS
INTRAVENOUS | Status: DC | PRN
  Administered 2017-03-17: 30 mg via INTRAVENOUS
  Administered 2017-03-17: 50 mg via INTRAVENOUS
  Administered 2017-03-17: 100 mg via INTRAVENOUS
  Administered 2017-03-17: 50 mg via INTRAVENOUS

## 2017-03-17 MED ORDER — PROPOFOL 500 MG/50ML IV EMUL
INTRAVENOUS | Status: AC
  Filled 2017-03-17: qty 50

## 2017-03-17 NOTE — Anesthesia Procedure Notes (Signed)
Date/Time: 03/17/2017 10:14 AM Performed by: Junious Silk Pre-anesthesia Checklist: Patient identified, Emergency Drugs available, Suction available, Patient being monitored and Timeout performed Oxygen Delivery Method: Nasal cannula

## 2017-03-17 NOTE — Op Note (Signed)
[facility] Gastroenterology Patient Name: Shelby Long Procedure Date: 03/17/2017 9:41 AM MRN: [id number] Account #: [id number] Date of Birth: 09-30-1995 Admit Type: Outpatient Age: 21 Room: [facility] ENDO ROOM 1 Gender: Female Note Status: Finalized Procedure:            Upper GI endoscopy Indications:          Epigastric abdominal pain, Dyspepsia Providers:            Christena Deem, MD Referring MD:         Duane Lope. Judithann Sheen, MD (Referring MD) Medicines:            Monitored Anesthesia Care Complications:        No immediate complications. Procedure:            Pre-Anesthesia Assessment:                       - ASA Grade Assessment: I - A normal, healthy patient.                       After obtaining informed consent, the endoscope was                        passed under direct vision. Throughout the procedure,                        the patient's blood pressure, pulse, and oxygen                        saturations were monitored continuously. The Endoscope                        was introduced through the mouth, and advanced to the                        third part of duodenum. The upper GI endoscopy was                        accomplished without difficulty. The patient tolerated                        the procedure well. Findings:      The Z-line was regular. Biopsies were taken with a cold forceps for       histology.      The exam of the esophagus was otherwise normal.      Patchy minimal inflammation characterized by erythema was found in the       gastric body. Biopsies were taken with a cold forceps for histology.      The cardia and gastric fundus were normal on retroflexion.      The exam of the stomach was otherwise normal.      Diffuse mild mucosal variance characterized by altered texture was found       in the entire duodenum. Biopsies were taken with a cold forceps for       histology. Impression:           - Z-line regular. Biopsied.                   - Gastritis. Biopsied.                       -  Mucosal variant in the duodenum. Biopsied. Recommendation:       - Discharge patient to home.                       - Use Protonix (pantoprazole) 40 mg PO daily daily. Procedure Code(s):    --- Professional ---                       [phone], Esophagogastroduodenoscopy, flexible, transoral;                        with biopsy, single or multiple Diagnosis Code(s):    --- Professional ---                       K29.70, Gastritis, unspecified, without bleeding                       K31.89, Other diseases of stomach and duodenum                       R10.13, Epigastric pain CPT copyright 2016 American Medical Association. All rights reserved. The codes documented in this report are preliminary and upon coder review may  be revised to meet current compliance requirements. Christena Deem, MD 03/17/2017 10:26:00 AM This report has been signed electronically. Number of Addenda: 0 Note Initiated On: 03/17/2017 9:41 AM      [facility]

## 2017-03-17 NOTE — Transfer of Care (Signed)
Immediate Anesthesia Transfer of Care Note  Patient: Shelby Long  Procedure(s) Performed: Procedure(s): ESOPHAGOGASTRODUODENOSCOPY (EGD) WITH PROPOFOL (N/A)  Patient Location: PACU  Anesthesia Type:General  Level of Consciousness: sedated  Airway & Oxygen Therapy: Patient Spontanous Breathing and Patient connected to nasal cannula oxygen  Post-op Assessment: Report given to RN and Post -op Vital signs reviewed and stable  Post vital signs: Reviewed and stable  Last Vitals:  Vitals:   03/17/17 0900  BP: 125/83  Pulse: 61  Resp: 16  Temp: (!) 36.2 C  SpO2: 100%    Last Pain:  Vitals:   03/17/17 0900  TempSrc: Tympanic         Complications: No apparent anesthesia complications

## 2017-03-17 NOTE — Anesthesia Post-op Follow-up Note (Signed)
Anesthesia QCDR form completed.        

## 2017-03-17 NOTE — Anesthesia Preprocedure Evaluation (Signed)
Anesthesia Evaluation  Patient identified by MRN, date of birth, ID band Patient awake    Reviewed: Allergy & Precautions, H&P , NPO status , Patient's Chart, lab work & pertinent test results, reviewed documented beta blocker date and time   History of Anesthesia Complications (+) PONV and history of anesthetic complications  Airway Mallampati: I  TM Distance: >3 FB Neck ROM: full    Dental  (+) Dental Advidsory Given, Teeth Intact   Pulmonary neg pulmonary ROS,           Cardiovascular Exercise Tolerance: Good Hypertension: history of pre-eclampsia. negative cardio ROS       Neuro/Psych negative neurological ROS  negative psych ROS   GI/Hepatic negative GI ROS, Neg liver ROS,   Endo/Other  negative endocrine ROS  Renal/GU negative Renal ROS  negative genitourinary   Musculoskeletal   Abdominal   Peds  Hematology negative hematology ROS (+)   Anesthesia Other Findings Past Medical History: No date: Medical history non-contributory No date: PONV (postoperative nausea and vomiting) No date: Preeclampsia     Comment:  Resolved after birth of her daughter    Reproductive/Obstetrics negative OB ROS                             Anesthesia Physical Anesthesia Plan  ASA: I  Anesthesia Plan: General   Post-op Pain Management:    Induction: Intravenous  PONV Risk Score and Plan: 4 or greater and Midazolam and Propofol infusion  Airway Management Planned: Natural Airway and Nasal Cannula  Additional Equipment:   Intra-op Plan:   Post-operative Plan:   Informed Consent: I have reviewed the patients History and Physical, chart, labs and discussed the procedure including the risks, benefits and alternatives for the proposed anesthesia with the patient or authorized representative who has indicated his/her understanding and acceptance.   Dental Advisory Given  Plan Discussed with:  Anesthesiologist, CRNA and Surgeon  Anesthesia Plan Comments:         Anesthesia Quick Evaluation

## 2017-03-17 NOTE — H&P (Signed)
Outpatient short stay form Pre-procedure 03/17/2017 9:39 AM Shelby Deem MD  Primary Physician: Dr. Aram Beecham  Reason for visit:  EGD  History of present illness:  Patient is a 21 year old female presenting today as above. She has a personal history of epigastric discomfort as well as nausea that goes back at least about 9 months. At that time she had delivered after episode of preeclampsia. She is not currently on proton pump inhibitor. She has no diarrhea. At times he epigastric discomfort will awaken her at night. She has minimal right upper quadrant discomfort and no radiation to the back. She takes no aspirin or blood thinning agents.    Current Facility-Administered Medications:  .  0.9 %  sodium chloride infusion, , Intravenous, Continuous, Shelby Deem, MD, Last Rate: 20 mL/hr at 03/17/17 0914 .  0.9 %  sodium chloride infusion, , Intravenous, Continuous, Shelby Deem, MD  Prescriptions Prior to Admission  Medication Sig Dispense Refill Last Dose  . norethindrone-ethinyl estradiol (LOESTRIN 1/20, 21,) 1-20 MG-MCG tablet Take 1 tablet by mouth daily. 3 Package 4 03/16/2017 at 2100     Allergies  Allergen Reactions  . Other Anaphylaxis    PEANUTS  . Shellfish-Derived Products Anaphylaxis  . Sulfa Antibiotics Anaphylaxis and Swelling  . Clindamycin/Lincomycin Swelling    Swelling to patient's tongue and lips. Patient later stated she had trouble breathing.   Marland Kitchen Penicillins Swelling  . Vancomycin Swelling     Past Medical History:  Diagnosis Date  . Medical history non-contributory   . PONV (postoperative nausea and vomiting)   . Preeclampsia    Resolved after birth of her daughter     Review of systems:      Physical Exam    Heart and lungs: Regular rate and rhythm without rub or gallop, lungs are bilaterally clear.    HEENT: Normocephalic atraumatic eyes are anicteric    Other:     Pertinant exam for procedure: Soft positive tender to  palpation in the epigastric region no masses rebound or organomegaly.    Planned proceedures: EGD and indicated procedures. I have discussed the risks benefits and complications of procedures to include not limited to bleeding, infection, perforation and the risk of sedation and the patient wishes to proceed.    Shelby Deem, MD Gastroenterology 03/17/2017  9:39 AM

## 2017-03-17 NOTE — Anesthesia Postprocedure Evaluation (Signed)
Anesthesia Post Note  Patient: Shelby Long  Procedure(s) Performed: Procedure(s) (LRB): ESOPHAGOGASTRODUODENOSCOPY (EGD) WITH PROPOFOL (N/A)  Patient location during evaluation: Endoscopy Anesthesia Type: General Level of consciousness: awake and alert Pain management: pain level controlled Vital Signs Assessment: post-procedure vital signs reviewed and stable Respiratory status: spontaneous breathing, nonlabored ventilation, respiratory function stable and patient connected to nasal cannula oxygen Cardiovascular status: blood pressure returned to baseline and stable Postop Assessment: no signs of nausea or vomiting Anesthetic complications: no     Last Vitals:  Vitals:   03/17/17 1047 03/17/17 1057  BP: 114/77 111/75  Pulse: (!) 56 (!) 57  Resp: 10 14  Temp:    SpO2: 100% 100%    Last Pain:  Vitals:   03/17/17 1027  TempSrc: Tympanic                 Lenard Simmer

## 2017-03-18 ENCOUNTER — Encounter: Payer: Self-pay | Admitting: Gastroenterology

## 2017-03-19 LAB — SURGICAL PATHOLOGY

## 2017-03-20 ENCOUNTER — Ambulatory Visit: Payer: 59 | Admitting: Physical Therapy

## 2017-03-20 DIAGNOSIS — M533 Sacrococcygeal disorders, not elsewhere classified: Secondary | ICD-10-CM | POA: Diagnosis not present

## 2017-03-20 DIAGNOSIS — R2689 Other abnormalities of gait and mobility: Secondary | ICD-10-CM

## 2017-03-20 DIAGNOSIS — M6281 Muscle weakness (generalized): Secondary | ICD-10-CM

## 2017-03-20 NOTE — Therapy (Signed)
[place] [facility] MAIN [facility] SERVICES [address] [place], [place], [address] Phone: [phone]   Fax:  [phone]  Physical Therapy Evaluation  Patient Details  Name: Shelby Long MRN: [id number] Date of Birth: 09-Feb-1996 Referring Provider: Jeralyn Bennett  Encounter Date: 03/11/2017    Past Medical History:  Diagnosis Date  . Medical history non-contributory   . PONV (postoperative nausea and vomiting)   . Preeclampsia    Resolved after birth of her daughter     Past Surgical History:  Procedure Laterality Date  . CESAREAN SECTION    . CYST EXCISION N/A    on tailbone  . ESOPHAGOGASTRODUODENOSCOPY (EGD) WITH PROPOFOL N/A 03/17/2017   Procedure: ESOPHAGOGASTRODUODENOSCOPY (EGD) WITH PROPOFOL;  Surgeon: Christena Deem, MD;  Location: [facility] ENDOSCOPY;  Service: Endoscopy;  Laterality: N/A;  . WISDOM TOOTH EXTRACTION  2015    There were no vitals filed for this visit.       Subjective Assessment - 03/20/17 0802    Subjective 1) pelvic pain started 1 month after her first pregnancy and delivery. Pt is 9 months post-partum. Pain occurs with functional activities, GYN/ OB exams, and wearing of tampons. Denied difficulty with bowel and urinary functions. Pt is not breastfeeding. Pt's dtr  weighs 15 lbs.  Denied radiating pain with pelvic pain.     2) R Hip pain with radiating pain to the outer side of knee. Pt compensates on the L leg which then also has radiating pain.  6-7/10. Occurs random times off and on. Pain occurs overnight . Pt sleeps on her side but when pain comes on, she sleeps on her stomach.   3) CLBP occured during pregnancy 2/2 kidney intfection which has cleared. This pain never went away. 5/10. Constant. no radiating pain. upper low back central.      Pertinent History Emergency C - section due pre-clampsia and Hellp Syndrome. Baby was 7 weeks premature, Pt was on a breathing tube 12 hours.    Patient Stated Goals Not hurt              [facility] PT Assessment - 03/20/17 0803      Assessment   Medical Diagnosis Dyspareunia    Referring Provider [place]     Precautions   Precautions None     Restrictions   Weight Bearing Restrictions No     Balance Screen   Has the patient fallen in the past 6 months No     Palpation   SI assessment  R ASIS more posterior/ superior than L, R malleoli higher than L, R SIJ limited mobility, referred to pelvic pain with palpation along lateral border of sacrum     Palpation comment Ischicavernosus R with tenderness             Objective measurements completed on examination: See above findings.        Pelvic Floor Special Questions - 03/20/17 0806    Diastasis Recti neg          [facility] Adult PT Treatment/Exercise - 03/20/17 0804      Exercises   Exercises --  see pt instructions     Manual Therapy   Manual therapy comments sacral mob, PA mob Grade II with MWM to correct pelvic obliquities                PT Education - 03/20/17 0806    Education provided Yes   Education Details HEP, POC, anatomy/ physiology, goals,   Person(s) Educated  Patient   Methods Explanation;Demonstration;Tactile cues;Verbal cues;Handout   Comprehension Returned demonstration;Verbalized understanding             PT Long Term Goals - 03/20/17 0809      PT LONG TERM GOAL #1   Title Pt will decrease her ODI score from  30% to < 15% in order to return to ADLs   Time 12   Period Weeks   Status New   Target Date 06/11/17     PT LONG TERM GOAL #2   Title Pt will demo no pelvic obliquities across 2 visits to decreased hip and low back pain and be able to play with child   Time 12   Period Weeks   Status New   Target Date 04/30/17     PT LONG TERM GOAL #3   Title Pt will demo no pelvic floor mm tensions with palpation across 2 visits in order to restore pelvic floor functions   Time 6   Period Weeks   Status New   Target Date 04/23/17                 Plan - 03/20/17 0807    Clinical Impression Statement Pt is a 21 yo female who reports pelvic pain, R hip pain, and CLBP which impact her QOL and ADLs. Pt's clinical presentations include pelvic obliquities, SIJ hypomobility, and pelvic floor mm overactivity. Her impairments are related to post-partum changes and will benefit from skilled pelvic health PT.  Following Tx, pt demo'd improved SIJ mobility.    Clinical Presentation Evolving   Clinical Decision Making High   Rehab Potential Good   PT Frequency 1x / week   PT Duration 12 weeks   PT Treatment/Interventions Moist Heat;Neuromuscular re-education;Therapeutic activities;Therapeutic exercise;Manual techniques;Manual lymph drainage;Stair training;Gait training;Functional mobility training;Patient/family education   Consulted and Agree with Plan of Care Patient      Patient will benefit from skilled therapeutic intervention in order to improve the following deficits and impairments:  Hypomobility, Increased muscle spasms, Postural dysfunction, Decreased endurance, Decreased range of motion, Decreased strength, Decreased mobility, Decreased coordination, Decreased scar mobility, Improper body mechanics, Pain  Visit Diagnosis: Sacrococcygeal disorders, not elsewhere classified  Muscle weakness (generalized)  Other abnormalities of gait and mobility     Problem List Patient Active Problem List   Diagnosis Date Noted  . Anxiety 06/02/2016  . History of depression 06/02/2016    Mariane Masters ,PT, DPT, E-RYT  03/20/2017, 8:12 AM  [place] [facility] MAIN [facility] SERVICES [address] [place], [place], [address] Phone: [phone]   Fax:  [phone]  Name: Shelby Long MRN: [id number] Date of Birth: 03/30/1996

## 2017-03-20 NOTE — Patient Instructions (Signed)
Open book 15 reps  Each     Deep core ( handout )

## 2017-03-20 NOTE — Therapy (Signed)
[facility] MAIN [facility] SERVICES [address] [place], [place], [address] Phone: [phone]   Fax:  [phone]  Physical Therapy Treatment  Patient Details  Name: Shelby Long MRN: [id number] Date of Birth: 04/29/1996 Referring Provider: Jeralyn Bennett  Encounter Date: 03/20/2017      PT End of Session - 03/20/17 1012    Visit Number 2   Number of Visits 12   Date for PT Re-Evaluation 07/22/17   PT Start Time 0912   PT Stop Time 1004   PT Time Calculation (min) 52 min      Past Medical History:  Diagnosis Date  . Medical history non-contributory   . PONV (postoperative nausea and vomiting)   . Preeclampsia    Resolved after birth of her daughter     Past Surgical History:  Procedure Laterality Date  . CESAREAN SECTION    . CYST EXCISION N/A    on tailbone  . ESOPHAGOGASTRODUODENOSCOPY (EGD) WITH PROPOFOL N/A 03/17/2017   Procedure: ESOPHAGOGASTRODUODENOSCOPY (EGD) WITH PROPOFOL;  Surgeon: Christena Deem, MD;  Location: [facility] ENDOSCOPY;  Service: Endoscopy;  Laterality: N/A;  . WISDOM TOOTH EXTRACTION  2015    There were no vitals filed for this visit.      Subjective Assessment - 03/20/17 0919    Subjective Pt reports she has not noticed any radiating pain since last session. Pt was sore after last session but the soreness went away after one day.    Pertinent History Emergency C - section due pre-clampsia and Hellp Syndrome. Baby was 7 weeks premature, Pt was on a breathing tube 12 hours.    Patient Stated Goals Not hurt             [facility] PT Assessment - 03/20/17 0942      Palpation   Spinal mobility Increased tensions at L T10-12, hypomobility at T 12, ribcage anterior elevation    SI assessment  pelvic mobility restored. B    Palpation comment pre Tx: tenderness at ischiocavernosus                   Pelvic Floor Special Questions - 03/20/17 0959    Pelvic Floor Internal Exam pt was explained the  details of exam, pt consented verbally without contraindications   Exam Type Vaginal   Palpation tenderness/ tensions at ischiocavernosus, 2nd 11 o clock    Strength --  no activation of muscles on cue for contraction           [facility] Adult PT Treatment/Exercise - 03/20/17 1001      Neuro Re-ed    Neuro Re-ed Details  deep core intructions , less chest breathing  and lowering ribcage with lateral excursion      Manual Therapy   Internal Pelvic Floor STM, fascial release at ischiocavenosus/ 11 o clock                 PT Education - 03/20/17 1002    Education provided Yes   Education Details HEP   Person(s) Educated Patient   Methods Explanation;Demonstration;Tactile cues;Verbal cues;Handout   Comprehension Returned demonstration;Verbalized understanding             PT Long Term Goals - 03/20/17 0809      PT LONG TERM GOAL #1   Title Pt will decrease her ODI score from  30% to < 15% in order to return to ADLs   Time 12   Period Weeks   Status New  Target Date 06/11/17     PT LONG TERM GOAL #2   Title Pt will demo no pelvic obliquities across 2 visits to decreased hip and low back pain and be able to play with child   Time 12   Period Weeks   Status New   Target Date 04/30/17     PT LONG TERM GOAL #3   Title Pt will demo no pelvic floor mm tensions with palpation across 2 visits in order to restore pelvic floor functions   Time 6   Period Weeks   Status New   Target Date 04/23/17               Plan - 03/20/17 1013    Clinical Impression Statement Pt demo'd no pelvic obliquities today and reports she has not had any radiating pain since last session. Pt showed increased fascial restriction R lateral abdominal scar into the medial aspect of iliac crest and also internally at ischiocavernosus mm R.     Rehab Potential Good   PT Frequency 1x / week   PT Duration 12 weeks   PT Treatment/Interventions Moist Heat;Neuromuscular  re-education;Therapeutic activities;Therapeutic exercise;Manual techniques;Manual lymph drainage;Stair training;Gait training;Functional mobility training;Patient/family education   Consulted and Agree with Plan of Care Patient      Patient will benefit from skilled therapeutic intervention in order to improve the following deficits and impairments:  Hypomobility, Increased muscle spasms, Postural dysfunction, Decreased endurance, Decreased range of motion, Decreased strength, Decreased mobility, Decreased coordination, Decreased scar mobility, Improper body mechanics, Pain  Visit Diagnosis: Muscle weakness (generalized)  Sacrococcygeal disorders, not elsewhere classified  Other abnormalities of gait and mobility     Problem List Patient Active Problem List   Diagnosis Date Noted  . Anxiety 06/02/2016  . History of depression 06/02/2016    Mariane Masters ,PT, DPT, E-RYT  03/20/2017, 11:33 AM  [place] [facility] MAIN [facility] SERVICES [address] [place], [place], [address] Phone: [phone]   Fax:  [phone]  Name: Shelby Long MRN: [id number] Date of Birth: 05-Dec-1995

## 2017-03-20 NOTE — Addendum Note (Signed)
Addended by: Mariane Masters on: 03/20/2017 08:15 AM   Modules accepted: Orders

## 2017-03-28 IMAGING — CR DG CHEST 2V
1 series · 2 of 2 positions shown · non-contrast
Comparison: None.

CLINICAL DATA: Pt having SOB for 1 week and tightness and sharp
chest pain on the left side..

EXAM:
CHEST  2 VIEW

[Series 1: dg chest 2 view · 0.14mm/px · 2 of 2 slices shown]
[im 1/2]
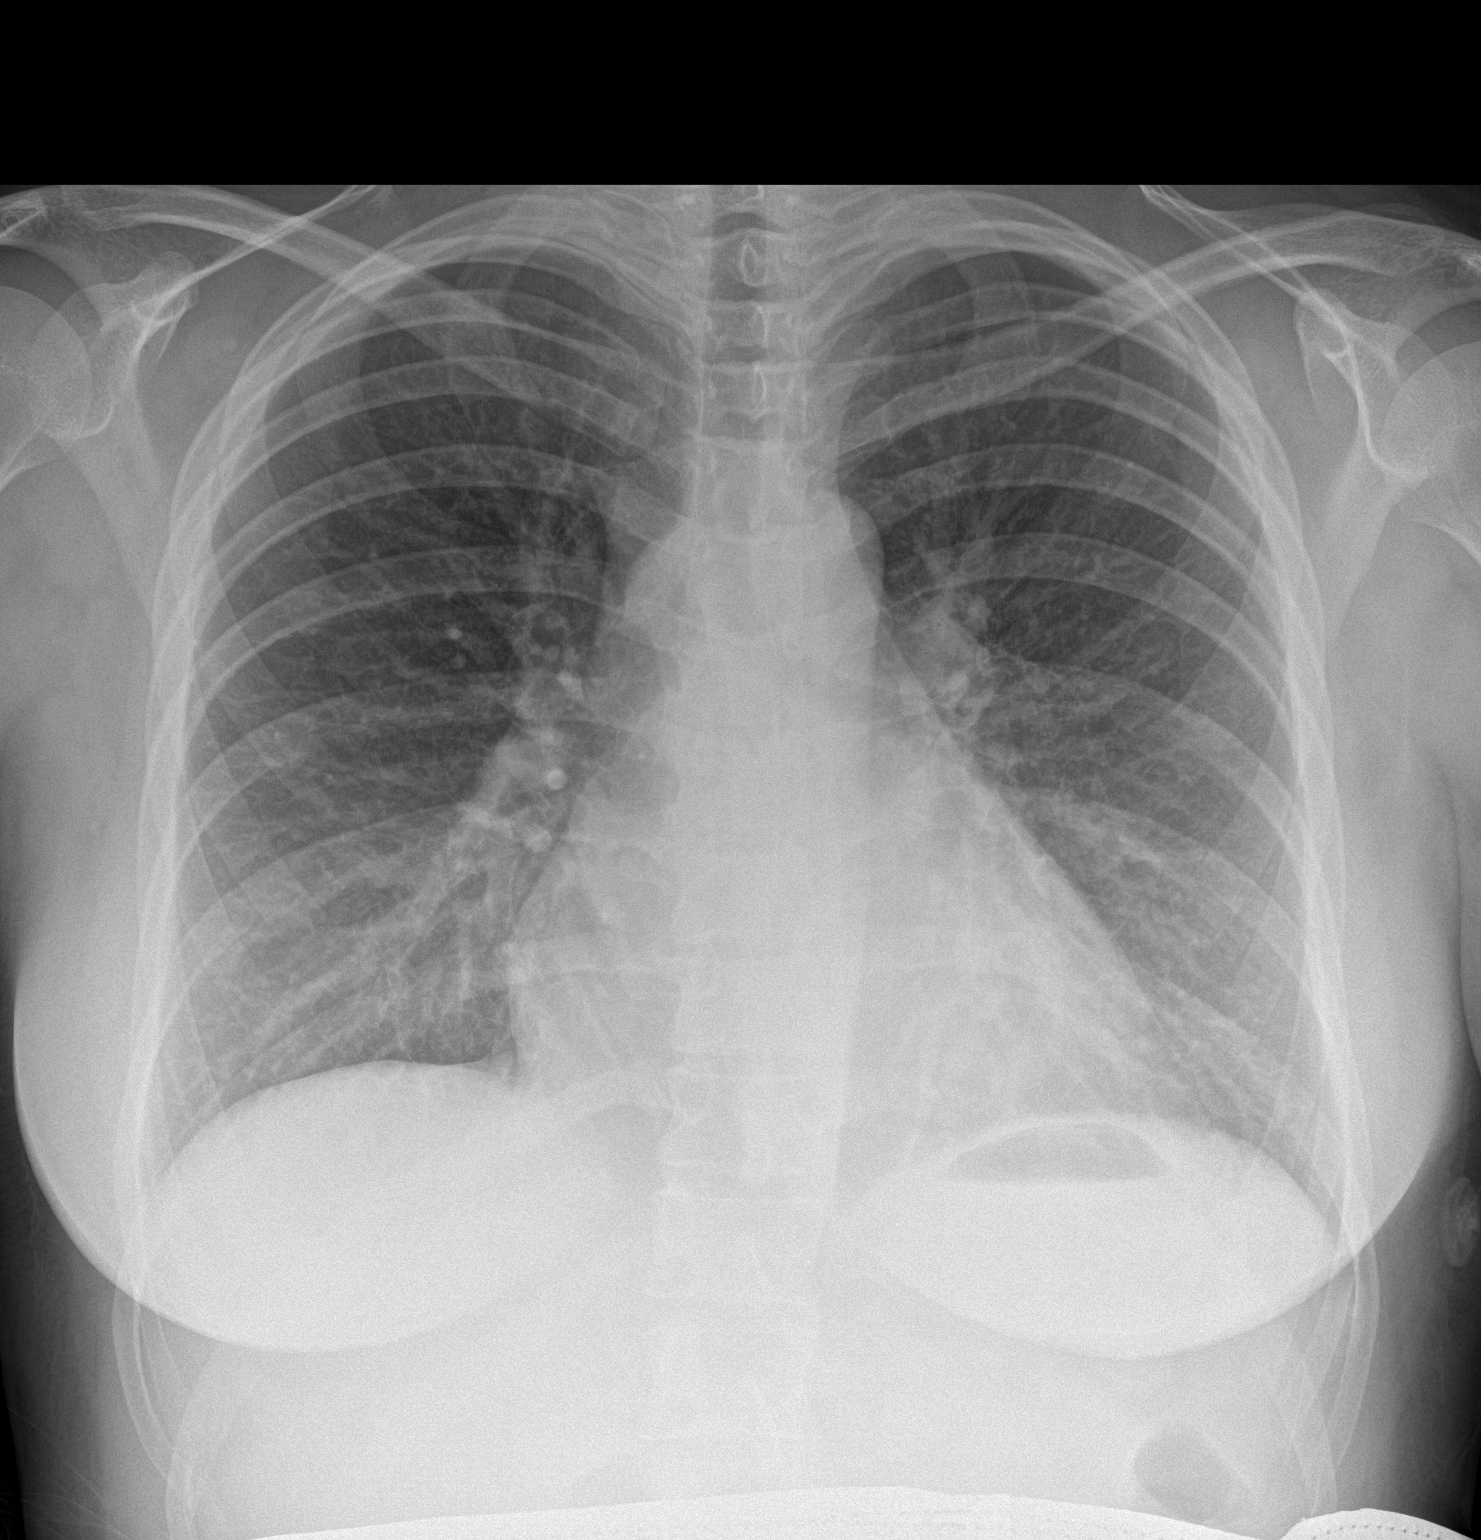
[im 2/2]
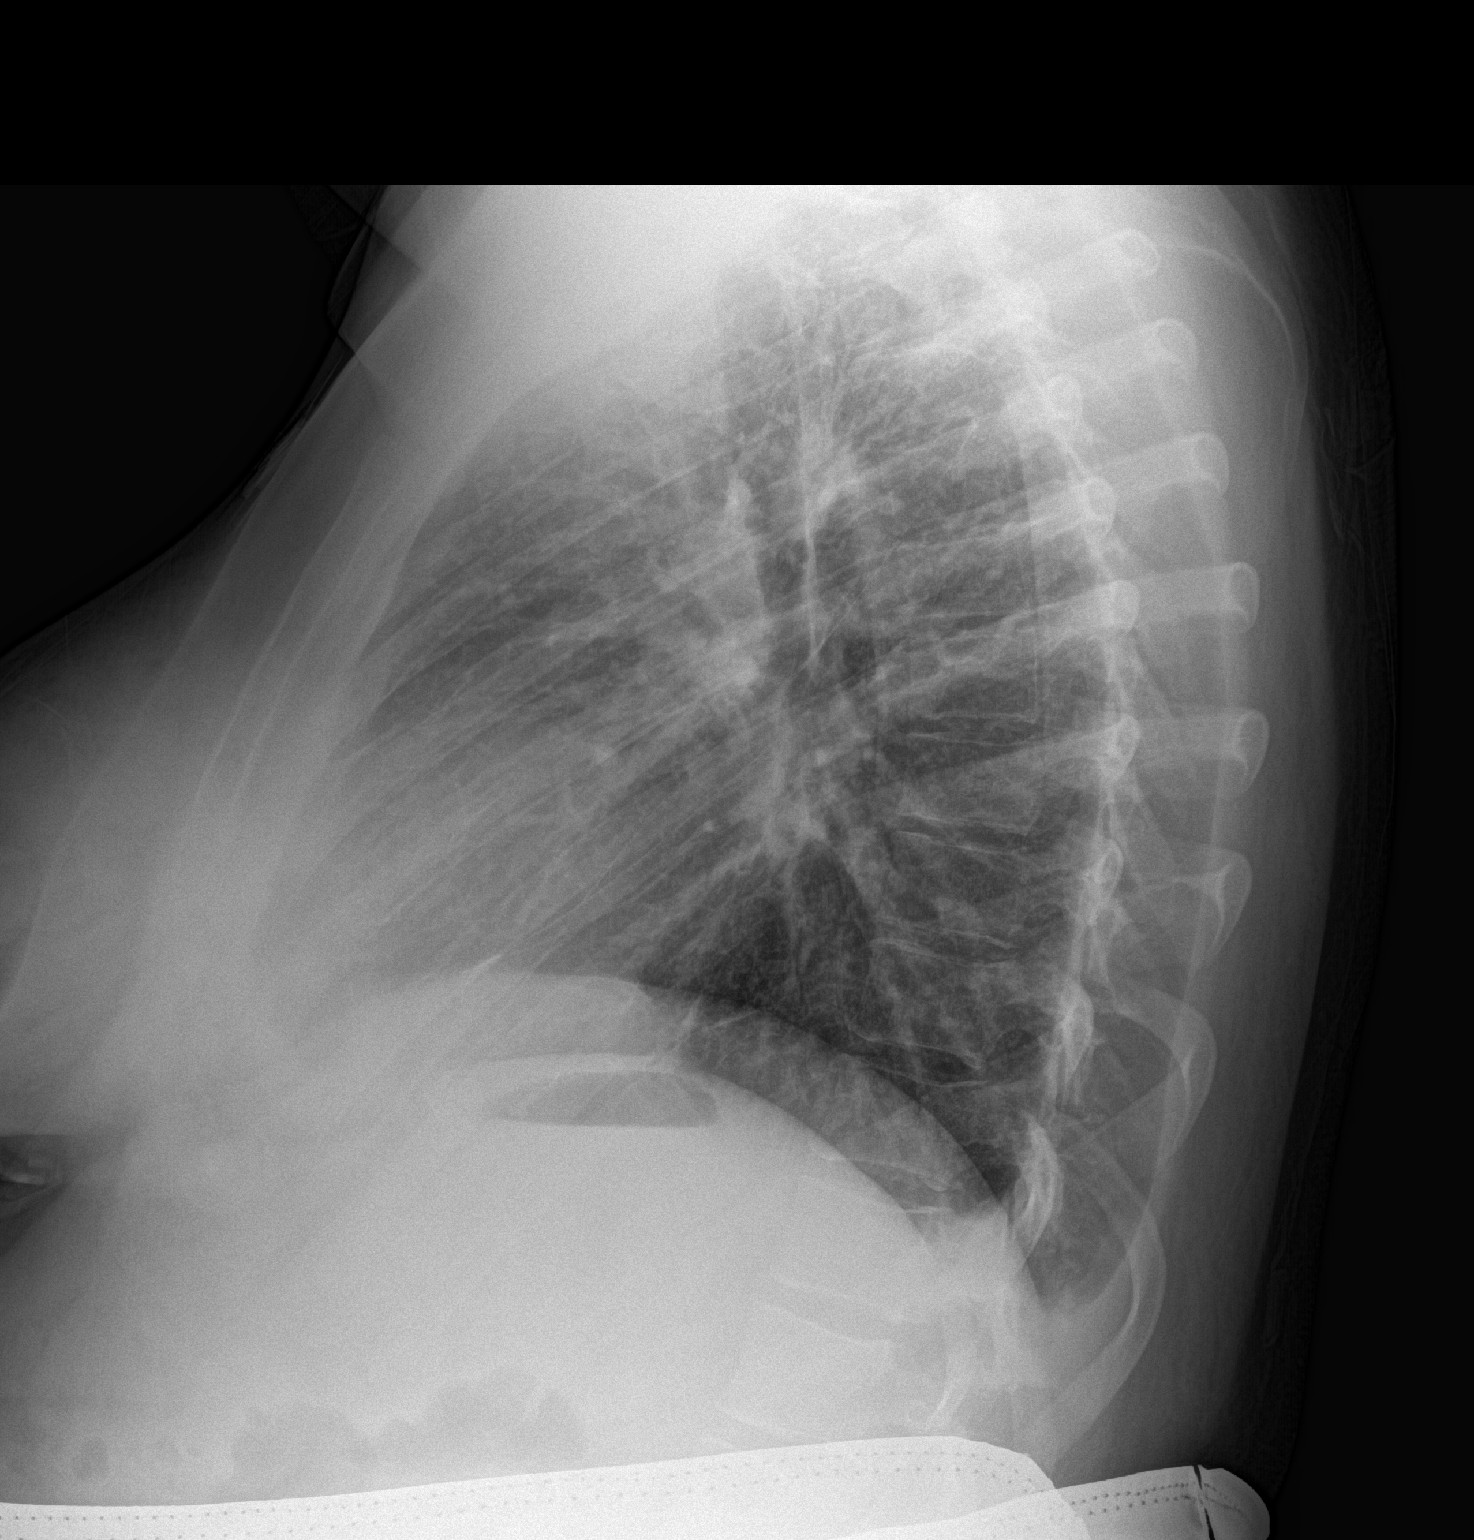

[2 of 2 positions shown; findings below may reference images not displayed]

FINDINGS: The heart size and mediastinal contours are within normal limits.
Both lungs are clear. No pleural effusion or pneumothorax. The
visualized skeletal structures are unremarkable.
IMPRESSION: Normal chest radiographs.

## 2017-04-01 ENCOUNTER — Ambulatory Visit: Payer: 59 | Attending: Certified Nurse Midwife | Admitting: Physical Therapy

## 2017-04-01 DIAGNOSIS — R2689 Other abnormalities of gait and mobility: Secondary | ICD-10-CM | POA: Diagnosis present

## 2017-04-01 DIAGNOSIS — M6281 Muscle weakness (generalized): Secondary | ICD-10-CM | POA: Diagnosis not present

## 2017-04-01 DIAGNOSIS — M533 Sacrococcygeal disorders, not elsewhere classified: Secondary | ICD-10-CM | POA: Diagnosis present

## 2017-04-01 NOTE — Patient Instructions (Addendum)
Prone Heel Press for strengthening sacro-iliac joints  1. Lie on your belly. If you have an arch in your low back or it feels umcomfortable, place a pillow under your low belly/hips to make sure your low back feel comfortable.   2. Place our forehead on top of your palms.      Widen your knees apart for starting position.   3. Inhale, feel belly and low back expand  4. Exhale, feel belly hug in, press heel together and count aloud for 5 sec. Then relax the heel squeezing.  Perform 10 reps of 5 sec holds. 2 sets/ day.    If you feel entire buttock tighten too much or feel low back pain, apply 50% less effort. As you press your heel together, you will feel as if your pubic bone (front of your pelvis) and sacrum (back of your pelvis) gentle move towards each other or your low abdominal muscles hug in more.         Alternate every 5 of prone heel with frog stretch :    Frog stretch: laying on belly with pillow under hips, knees bent, inhale do nothing, exhale let ankles fall apart  5 reps    __________  Use semi -tandem ( lunge) stance for changing diapers/ placing [name] into stroller, and pulling leash   Blue band exercise: at door knob,  Exhale as you pull ( 10 reps x 2) each foot forward

## 2017-04-01 NOTE — Therapy (Signed)
[place] [facility] MAIN [facility] SERVICES [address] [place], [place], [address] Phone: [phone]   Fax:  [phone]  Physical Therapy Treatment  Patient Details  Name: Shelby Long MRN: [id number] Date of Birth: 1996-01-22 Referring Provider: Jeralyn Bennett  Encounter Date: 04/01/2017      PT End of Session - 04/01/17 0944    Visit Number 3   Number of Visits 12   Date for PT Re-Evaluation 07/22/17   PT Start Time 0907   PT Stop Time 0957   PT Time Calculation (min) 50 min      Past Medical History:  Diagnosis Date  . Medical history non-contributory   . PONV (postoperative nausea and vomiting)   . Preeclampsia    Resolved after birth of her daughter     Past Surgical History:  Procedure Laterality Date  . CESAREAN SECTION    . CYST EXCISION N/A    on tailbone  . ESOPHAGOGASTRODUODENOSCOPY (EGD) WITH PROPOFOL N/A 03/17/2017   Procedure: ESOPHAGOGASTRODUODENOSCOPY (EGD) WITH PROPOFOL;  Surgeon: Christena Deem, MD;  Location: [facility] ENDOSCOPY;  Service: Endoscopy;  Laterality: N/A;  . WISDOM TOOTH EXTRACTION  2015    There were no vitals filed for this visit.      Subjective Assessment - 04/01/17 0910    Subjective Pt states her pelvic pain improved.  Pt 's radiating pain has improved but has returned at a lesser degree and eases more quickly .    Pertinent History Emergency C - section due pre-clampsia and Hellp Syndrome. Baby was 7 weeks premature, Pt was on a breathing tube 12 hours.    Patient Stated Goals Not hurt             [facility] PT Assessment - 04/01/17 0942      Palpation   SI assessment  R ASIS more inferior, SIJ hypomobile hip flex/ER/abd                   Pelvic Floor Special Questions - 04/01/17 0929    Pelvic Floor Internal Exam pt was explained the details of exam, pt consented verbally without contraindications   Exam Type Vaginal   Palpation tensions at obt int B    Strength --  improved  cranial movement of pelvic floor           [facility] Adult PT Treatment/Exercise - 04/01/17 0942      Neuro Re-ed    Neuro Re-ed Details  pelvic floor lengthening       Exercises   Exercises --  see pt instructions     Manual Therapy   Manual therapy comments  PA mob Grade II with MWM to correct pelvic obliquities                PT Education - 04/01/17 0943    Education provided Yes   Education Details HEP   Person(s) Educated Patient   Methods Explanation;Demonstration;Tactile cues;Verbal cues;Handout   Comprehension Returned demonstration;Verbalized understanding             PT Long Term Goals - 04/01/17 0944      PT LONG TERM GOAL #1   Title Pt will decrease her ODI score from  30% to < 15% in order to return to ADLs   Time 12   Period Weeks   Status On-going     PT LONG TERM GOAL #2   Title Pt will demo no pelvic obliquities across 2 visits to decreased hip and low  back pain and be able to play with child   Time 12   Period Weeks   Status On-going     PT LONG TERM GOAL #3   Title Pt will demo no pelvic floor mm tensions with palpation across 2 visits in order to restore pelvic floor functions   Time 6   Period Weeks   Status On-going               Plan - 04/01/17 0944    Clinical Impression Statement Pt 's radiating pain has improved but has returned at a lesser degree and eases more quickly .  Pt showed minor pelvic girdle obliquities, abdominal scar restrictions, pelvic floor tightness. Post Tx, pt demo'd imporved pelvic floor mm ROM, increased lengthening, and improved SIJ mobility on R. Initiated body mechanics ( changing diapers, wpulling dog on leash)  and functional strengthening to minimize risk for injuries. Pt continues to benefit from skilled PT     Rehab Potential Good   PT Frequency 1x / week   PT Duration 12 weeks   PT Treatment/Interventions Moist Heat;Neuromuscular re-education;Therapeutic activities;Therapeutic  exercise;Manual techniques;Manual lymph drainage;Stair training;Gait training;Functional mobility training;Patient/family education   Consulted and Agree with Plan of Care Patient      Patient will benefit from skilled therapeutic intervention in order to improve the following deficits and impairments:  Hypomobility, Increased muscle spasms, Postural dysfunction, Decreased endurance, Decreased range of motion, Decreased strength, Decreased mobility, Decreased coordination, Decreased scar mobility, Improper body mechanics, Pain  Visit Diagnosis: Muscle weakness (generalized)  Sacrococcygeal disorders, not elsewhere classified  Other abnormalities of gait and mobility     Problem List Patient Active Problem List   Diagnosis Date Noted  . Anxiety 06/02/2016  . History of depression 06/02/2016    Shelby Long ,PT, DPT, E-RYT  04/01/2017, 10:01 AM  [facility] [address] , [place], [address] Phone: [phone]   Fax:  [phone]  Name: Shelby Long MRN: [id number] Date of Birth: Aug 03, 1995

## 2017-04-08 ENCOUNTER — Ambulatory Visit: Payer: 59 | Admitting: Physical Therapy

## 2017-04-08 DIAGNOSIS — M6281 Muscle weakness (generalized): Secondary | ICD-10-CM

## 2017-04-08 DIAGNOSIS — M533 Sacrococcygeal disorders, not elsewhere classified: Secondary | ICD-10-CM

## 2017-04-08 DIAGNOSIS — R2689 Other abnormalities of gait and mobility: Secondary | ICD-10-CM

## 2017-04-08 NOTE — Patient Instructions (Addendum)
minisquat on wall ( zip the zipper position)  Keeping low back engaged  Exhale on rise   10 reps    ______  Back stretch,  Hands on counter, "zip the zipper"    ______  Floor exercsies with daughter:   Shelby GamblesClam Shell 45 Degrees   Lying with hips and knees bent 45, one pillow between knees and ankles. Lift knee with exhale. Be sure pelvis does not roll backward. Do not arch back. Do 10 times, each leg, 2 times per day.  http://ss.exer.us/75   Copyright  VHI. All rights reserved.    _____   Deep core level 2 ( 6 min)    _____   All fours, toes tucked, oppositie leg and arm    10 reps

## 2017-04-08 NOTE — Therapy (Signed)
[place] MAIN [facility] SERVICES [address] [place], [place], [address] Phone: [phone]   Fax:  [phone]  Physical Therapy Treatment  Patient Details  Name: Shelby Long MRN: [id number] Date of Birth: [birth year] Referring Provider: Verdene Rio  Encounter Date: 04/08/2017      PT End of Session - 04/08/17 1348    Visit Number 4   Number of Visits 12   Date for PT Re-Evaluation 07/22/17   PT Start Time 1005   PT Stop Time 1100   PT Time Calculation (min) 55 min   Activity Tolerance Patient tolerated treatment well   Behavior During Therapy [facility] for tasks assessed/performed      Past Medical History:  Diagnosis Date  . Medical history non-contributory   . PONV (postoperative nausea and vomiting)   . Preeclampsia    Resolved after birth of her daughter     Past Surgical History:  Procedure Laterality Date  . CESAREAN SECTION    . CYST EXCISION N/A    on tailbone  . ESOPHAGOGASTRODUODENOSCOPY (EGD) WITH PROPOFOL N/A 03/17/2017   Procedure: ESOPHAGOGASTRODUODENOSCOPY (EGD) WITH PROPOFOL;  Surgeon: Lollie Sails, MD;  Location: [facility] ENDOSCOPY;  Service: Endoscopy;  Laterality: N/A;  . WISDOM TOOTH EXTRACTION  2015    There were no vitals filed for this visit.      Subjective Assessment - 04/08/17 1012    Subjective Pt reports her low back pain has been hurting froim upper low back to lower back.     Pertinent History Emergency C - section due pre-clampsia and Hellp Syndrome. Baby was 7 weeks premature, Pt was on a breathing tube 12 hours.    Patient Stated Goals Not hurt             [facility] PT Assessment - 04/08/17 1345      Palpation   Spinal mobility increased tensions at R Lumbar 1-4 interspinal mm L                  Pelvic Floor Special Questions - 04/08/17 1347    Pelvic Floor Internal Exam pt was explained the details of exam, pt consented verbally without contraindications   Exam Type Vaginal    Palpation tensions at obt int R,  puborectalis/ obt int L     Strength --  improved cranial movement of pelvic floor           [facility] Adult PT Treatment/Exercise - 04/08/17 1348      Neuro Re-ed    Neuro Re-ed Details  see pt instructions     Exercises   Exercises --  see pt instructions     Manual Therapy   Internal Pelvic Floor thiele massage, STM at obt int R , L anterior attachment puborectalis/ obt int L                 PT Education - 04/08/17 1348    Education provided Yes   Education Details HEP   Person(s) Educated Patient   Methods Explanation;Demonstration;Tactile cues;Verbal cues;Handout   Comprehension Returned demonstration;Verbalized understanding             PT Long Term Goals - 04/08/17 1015      PT LONG TERM GOAL #1   Title Pt will decrease her ODI score from  30% to < 15% in order to return to ADLs   Time 12   Period Weeks   Status On-going     PT LONG TERM GOAL #2  Title Pt will demo no pelvic obliquities across 2 visits to decreased hip and low back pain and be able to play with child   Time 12   Period Weeks   Status Partially Met     PT LONG TERM GOAL #3   Title Pt will demo no pelvic floor mm tensions with palpation across 2 visits in order to restore pelvic floor functions   Time 6   Period Weeks   Status Partially Met               Plan - 04/08/17 1348    Clinical Impression Statement Pt reported 50% less pain in her LBP following Tx today. Pt showed decreased pelvic floor mm tensions and tenderness as well and has been able to return to functional activties with minor pelvic pain. Pt required propioception training for less lumbar lordosis and also deep core coordination with less chest breathing and more diaphragmatic excursion. Pt demo'd properly post Tx. Pt continues to benefit from skilled PT   Rehab Potential Good   PT Frequency 1x / week   PT Duration 12 weeks   PT Treatment/Interventions Moist  Heat;Neuromuscular re-education;Therapeutic activities;Therapeutic exercise;Manual techniques;Manual lymph drainage;Stair training;Gait training;Functional mobility training;Patient/family education   Consulted and Agree with Plan of Care Patient      Patient will benefit from skilled therapeutic intervention in order to improve the following deficits and impairments:  Hypomobility, Increased muscle spasms, Postural dysfunction, Decreased endurance, Decreased range of motion, Decreased strength, Decreased mobility, Decreased coordination, Decreased scar mobility, Improper body mechanics, Pain  Visit Diagnosis: Muscle weakness (generalized)  Sacrococcygeal disorders, not elsewhere classified  Other abnormalities of gait and mobility     Problem List Patient Active Problem List   Diagnosis Date Noted  . Anxiety 06/02/2016  . History of depression 06/02/2016    Jerl Mina ,PT, DPT, E-RYT  04/08/2017, 1:53 PM  [place] MAIN [facility] SERVICES [address] [place], [place], [address] Phone: [phone]   Fax:  [phone]  Name: Shelby Long MRN: [id number] Date of Birth: 1996/04/11

## 2017-04-22 ENCOUNTER — Ambulatory Visit: Payer: 59 | Admitting: Physical Therapy

## 2017-04-22 DIAGNOSIS — M533 Sacrococcygeal disorders, not elsewhere classified: Secondary | ICD-10-CM

## 2017-04-22 DIAGNOSIS — M6281 Muscle weakness (generalized): Secondary | ICD-10-CM | POA: Diagnosis not present

## 2017-04-22 DIAGNOSIS — R2689 Other abnormalities of gait and mobility: Secondary | ICD-10-CM

## 2017-04-22 NOTE — Therapy (Signed)
[place] MAIN [facility] SERVICES [address] [place], [place], [address] Phone: [phone]   Fax:  [phone]  Physical Therapy Treatment  Patient Details  Name: Shelby Long MRN: [id number] Date of Birth: [birth year] Referring Provider: Verdene Rio  Encounter Date: 04/22/2017      PT End of Session - 04/22/17 1048    Visit Number 5   Number of Visits 12   Date for PT Re-Evaluation 07/22/17   PT Start Time 1003   PT Stop Time 1056   PT Time Calculation (min) 53 min   Activity Tolerance Patient tolerated treatment well   Behavior During Therapy [facility] for tasks assessed/performed      Past Medical History:  Diagnosis Date  . Medical history non-contributory   . PONV (postoperative nausea and vomiting)   . Preeclampsia    Resolved after birth of her daughter     Past Surgical History:  Procedure Laterality Date  . CESAREAN SECTION    . CYST EXCISION N/A    on tailbone  . ESOPHAGOGASTRODUODENOSCOPY (EGD) WITH PROPOFOL N/A 03/17/2017   Procedure: ESOPHAGOGASTRODUODENOSCOPY (EGD) WITH PROPOFOL;  Surgeon: Lollie Sails, MD;  Location: [facility] ENDOSCOPY;  Service: Endoscopy;  Laterality: N/A;  . WISDOM TOOTH EXTRACTION  2015    There were no vitals filed for this visit.      Subjective Assessment - 04/22/17 1014    Subjective Pt reported her back resolved for a couple of days after last session. The day before yesterday, her back started hurting and she did have to move a light piece furniture. Yesterday it was real bad. It moved from the low back to teh L upper mid-low area. The pain is deep and it hurst when she stretches. pt was not able to lift her baby.     Pertinent History Emergency C - section due pre-clampsia and Hellp Syndrome. Baby was 7 weeks premature, Pt was on a breathing tube 12 hours.    Patient Stated Goals Not hurt             [facility] PT Assessment - 04/22/17 1018      Observation/Other Assessments   Observations poor stabilirty in modified side plank      AROM   Overall AROM Comments L rotation ~45 deg reproduces pain      Palpation   Spinal mobility tensions and tenderness at L lateral L 2 , QL tensions proximal and distal attachments                      [facility] Adult PT Treatment/Exercise - 04/22/17 1045      Neuro Re-ed    Neuro Re-ed Details  see pt instructions     Exercises   Exercises --  see pt instructions     Manual Therapy   Internal Pelvic Floor STM, lateral to medial mob Grade III L2, STM traction along QL and paraspinals                 PT Education - 04/22/17 1059    Education provided Yes   Education Details HEP   Person(s) Educated Patient   Methods Explanation;Demonstration;Tactile cues;Verbal cues;Handout   Comprehension Verbalized understanding;Returned demonstration             PT Long Term Goals - 04/22/17 1017      PT LONG TERM GOAL #1   Title Pt will decrease her ODI score from  30% to < 15% in order to  return to ADLs   Time 12   Period Weeks   Status On-going     PT LONG TERM GOAL #2   Title Pt will demo no pelvic obliquities across 2 visits to decreased hip and low back pain and be able to play with child   Time 12   Period Weeks   Status Achieved     PT LONG TERM GOAL #3   Title Pt will demo no pelvic floor mm tensions with palpation across 2 visits in order to restore pelvic floor functions   Time 6   Period Weeks   Status Partially Met               Plan - 04/22/17 1059    Clinical Impression Statement Manual Tx addressed her back pain while new progressions of thoracolumbar strengthening exercises were also added. Customized exercises so they  can be performed while pt plays with dtr on the floor. Pt demo'd less tenderness and tightness to her back muscles post Tx and reported much improvement with her back pain compared to beginning to session. Pt continues to progress towards her goals with  skilled PT.    Rehab Potential Good   PT Frequency 1x / week   PT Duration 12 weeks   PT Treatment/Interventions Moist Heat;Neuromuscular re-education;Therapeutic activities;Therapeutic exercise;Manual techniques;Manual lymph drainage;Stair training;Gait training;Functional mobility training;Patient/family education   Consulted and Agree with Plan of Care Patient      Patient will benefit from skilled therapeutic intervention in order to improve the following deficits and impairments:  Hypomobility, Increased muscle spasms, Postural dysfunction, Decreased endurance, Decreased range of motion, Decreased strength, Decreased mobility, Decreased coordination, Decreased scar mobility, Improper body mechanics, Pain  Visit Diagnosis: Muscle weakness (generalized)  Sacrococcygeal disorders, not elsewhere classified  Other abnormalities of gait and mobility     Problem List Patient Active Problem List   Diagnosis Date Noted  . Anxiety 06/02/2016  . History of depression 06/02/2016    Jerl Mina ,PT, DPT, E-RYT  04/22/2017, 11:01 AM  [place] MAIN [facility] SERVICES [address] [place], [place], [address] Phone: [phone]   Fax:  [phone]  Name: INNOCENCE SCHLOTZHAUER MRN: [id number] Date of Birth: [birth year]

## 2017-04-22 NOTE — Patient Instructions (Signed)
  _______  Stretches for low back:   Side of hip stretch:  Reclined twist for hips and side of the hips/ legs  Lay on your back, knees bend Scoot hips to the R , leave shoulders in place Strap on ballmounds of R foot, hold strap in L hand Straighten L leg and drop R leg over to the L, resting onto pillows to keep leg at the same width of hips 5 breaths   Switch sides    Knee to chest  5 reps    ________  Strengthening   Bridges  Lifting hips on exhale, to the point when pelvis is not wobbling 10 reps   Stabilize ( see picture)       sideplanks Alignment see pictures   5 rep--> 10 reps       birddog 10 reps  Alignment see pictures

## 2017-05-06 ENCOUNTER — Ambulatory Visit: Payer: 59 | Attending: Certified Nurse Midwife | Admitting: Physical Therapy

## 2017-05-06 DIAGNOSIS — M533 Sacrococcygeal disorders, not elsewhere classified: Secondary | ICD-10-CM | POA: Insufficient documentation

## 2017-05-06 DIAGNOSIS — M6281 Muscle weakness (generalized): Secondary | ICD-10-CM | POA: Insufficient documentation

## 2017-05-06 DIAGNOSIS — R2689 Other abnormalities of gait and mobility: Secondary | ICD-10-CM | POA: Insufficient documentation

## 2017-05-06 NOTE — Patient Instructions (Addendum)
Prone Heel Press for strengthening sacro-iliac joints  1. Lie on your belly. If you have an arch in your low back or it feels umcomfortable, place a pillow under your low belly/hips to make sure your low back feel comfortable.   2. Place our forehead on top of your palms.      Widen your knees apart for starting position.   3. Inhale, feel belly and low back expand  4. Exhale, feel belly hug in, press heel together and count aloud for 5 sec. Then relax the heel squeezing.  Perform 5 reps of 5 sec holds.  X 2 day  .    If you feel entire buttock tighten too much or feel low back pain, apply 50% less effort. As you press your heel together, you will feel as if your pubic bone (front of your pelvis) and sacrum (back of your pelvis) gentle move towards each other or your low abdominal muscles hug in more.        ---- Frog stretch 5 reps  in between 5 reps of prone heel press.     __    Clam Shell 45 Degrees   Lying with hips and knees bent 45, one pillow between knees and ankles. Lift knee with exhale. Be sure pelvis does not roll backward. Do not arch back. Do 10 times, each leg, 2 times per day.    R  Only     ______  Pushing stroller down ramp with more weight into the legs, longer strides to activate trunk muscles   _____ Walking inside band at door knob  A few steps forward and backward against resistance to activate trunk and pelvic muscles   10 reps forward and back wards

## 2017-05-07 NOTE — Therapy (Signed)
[place] MAIN [facility] SERVICES [address] [place], [place], [address] Phone: [phone]   Fax:  [phone]  Physical Therapy Treatment  Patient Details  Name: Shelby Long MRN: [id number] Date of Birth: 1996-02-13 Referring Provider: Verdene Rio  Encounter Date: 05/06/2017      PT End of Session - 05/07/17 1546    Visit Number 6   Number of Visits 12   Date for PT Re-Evaluation 07/22/17   PT Start Time 5681   PT Stop Time 1100   PT Time Calculation (min) 45 min   Activity Tolerance Patient tolerated treatment well   Behavior During Therapy [facility] for tasks assessed/performed      Past Medical History:  Diagnosis Date  . Medical history non-contributory   . PONV (postoperative nausea and vomiting)   . Preeclampsia    Resolved after birth of her daughter     Past Surgical History:  Procedure Laterality Date  . CESAREAN SECTION    . CYST EXCISION N/A    on tailbone  . ESOPHAGOGASTRODUODENOSCOPY (EGD) WITH PROPOFOL N/A 03/17/2017   Procedure: ESOPHAGOGASTRODUODENOSCOPY (EGD) WITH PROPOFOL;  Surgeon: Lollie Sails, MD;  Location: [facility] ENDOSCOPY;  Service: Endoscopy;  Laterality: N/A;  . WISDOM TOOTH EXTRACTION  2015    There were no vitals filed for this visit.      Subjective Assessment - 05/07/17 2138    Subjective Pt reports her back hurt alittle bit but it eventually eased up. Today, pt reports her R hip is bothering her. It is a constant annoying ache but not shooting down. 5/10.  It started yesterday but had not been as much a issue since starting PT 2 months ago. Pt has been engaging in more activities.  It occurs more with down steps, or ramp with pushing a grocery store, and up hills as well.       Pertinent History Emergency C - section due pre-clampsia and Hellp Syndrome. Baby was 7 weeks premature, Pt was on a breathing tube 12 hours.    Patient Stated Goals Not hurt                       Pelvic  Floor Special Questions - 05/07/17 1540    Pelvic Floor Internal Exam pt was explained the details of exam, pt consented verbally without contraindications   Exam Type Vaginal   Palpation tensions at obt int R, puborectalis anterior L ( post cues for pelvic mobility and stability: less tensions. no manual Tx applied)                [facility] Adult PT Treatment/Exercise - 05/07/17 1540      Neuro Re-ed    Neuro Re-ed Details  see pt instructions, pushing stroller with more lower kinetic chain activation, eccentric control.  Theraband at waist : gait training while pushing stroller w/ weights in it to simulate baby in stroller       Exercises   Exercises --  see pt instructions     Manual Therapy   Internal Pelvic Floor thiele massage, STM at obt int R , L anterior attachment puborectalis/ obt int L                      PT Long Term Goals - 05/06/17 1019      PT LONG TERM GOAL #1   Title Pt will decrease her ODI score from  30% to < 15% in order  to return to ADLs   Time 12   Period Weeks   Status On-going     PT LONG TERM GOAL #2   Title Pt will demo no pelvic obliquities across 2 visits to decreased hip and low back pain and be able to play with child   Time 12   Period Weeks   Status Achieved     PT LONG TERM GOAL #3   Title Pt will demo no pelvic floor mm tensions with palpation across 2 visits in order to restore pelvic floor functions   Time 6   Period Weeks   Status Partially Met     PT LONG TERM GOAL #4   Title Pt will increase hip abduction on R hip from 3/5 to 4/5 in order to minimize pain and optimize pelvic stability for going down hill with full shopping cart    Time 12   Period Weeks   Status New               Plan - 05/07/17 1547    Clinical Impression Statement Pt has been able to increase her functional activities the past weeks but today she returned reporting her R hip and LBP has returned after a eriod of a month of it not bothering  her. Pt showed the pelvic floor overactivity and pelvic obliquity in minor areas similiar to last session. Manual Tx relieved the pain and tightness. Advanced pt to pelvic stability exercises and eccentric strenghening of BLE and upright deep core strengethening to help pt have less pain with descending stairs and  pushing stroller w/ baby down ramps.  Pt continues to benefit from skilled PT to address pelvic stability and addressing her scoliosis.     Rehab Potential Good   PT Frequency 1x / week   PT Duration 12 weeks   PT Treatment/Interventions Moist Heat;Neuromuscular re-education;Therapeutic activities;Therapeutic exercise;Manual techniques;Manual lymph drainage;Stair training;Gait training;Functional mobility training;Patient/family education   Consulted and Agree with Plan of Care Patient      Patient will benefit from skilled therapeutic intervention in order to improve the following deficits and impairments:  Hypomobility, Increased muscle spasms, Postural dysfunction, Decreased endurance, Decreased range of motion, Decreased strength, Decreased mobility, Decreased coordination, Decreased scar mobility, Improper body mechanics, Pain  Visit Diagnosis: Muscle weakness (generalized)  Sacrococcygeal disorders, not elsewhere classified  Other abnormalities of gait and mobility     Problem List Patient Active Problem List   Diagnosis Date Noted  . Anxiety 06/02/2016  . History of depression 06/02/2016    Jerl Mina ,PT, DPT, E-RYT  05/07/2017, 9:38 PM  [place] MAIN [facility] SERVICES [address] [place], [place], [address] Phone: [phone]   Fax:  [phone]  Name: Shelby Long MRN: [id number] Date of Birth: [birth year]

## 2017-06-06 ENCOUNTER — Ambulatory Visit: Payer: 59 | Admitting: Physical Therapy

## 2017-06-27 ENCOUNTER — Ambulatory Visit: Payer: 59 | Attending: Certified Nurse Midwife | Admitting: Physical Therapy

## 2017-07-03 ENCOUNTER — Encounter: Payer: Self-pay | Admitting: Certified Nurse Midwife

## 2017-07-04 ENCOUNTER — Other Ambulatory Visit: Payer: Self-pay

## 2017-07-04 MED ORDER — NORETHINDRONE ACET-ETHINYL EST 1-20 MG-MCG PO TABS: 1.0000 | ORAL_TABLET | Freq: Every day | ORAL | 2 refills | Status: DC

## 2017-12-16 ENCOUNTER — Ambulatory Visit (INDEPENDENT_AMBULATORY_CARE_PROVIDER_SITE_OTHER): Payer: 59 | Admitting: Certified Nurse Midwife

## 2017-12-16 ENCOUNTER — Encounter: Payer: Self-pay | Admitting: Certified Nurse Midwife

## 2017-12-16 VITALS — BP 120/80 | HR 92 | Ht 67.0 in | Wt 206.2 lb

## 2017-12-16 DIAGNOSIS — R635 Abnormal weight gain: Secondary | ICD-10-CM

## 2017-12-16 DIAGNOSIS — Z01419 Encounter for gynecological examination (general) (routine) without abnormal findings: Secondary | ICD-10-CM

## 2017-12-16 DIAGNOSIS — Z6832 Body mass index (BMI) 32.0-32.9, adult: Secondary | ICD-10-CM | POA: Diagnosis not present

## 2017-12-16 DIAGNOSIS — Z3009 Encounter for other general counseling and advice on contraception: Secondary | ICD-10-CM

## 2017-12-16 NOTE — Patient Instructions (Addendum)
Preventive Care 18-39 Years, Female Preventive care refers to lifestyle choices and visits with your health care provider that can promote health and wellness. What does preventive care include?  A yearly physical exam. This is also called an annual well check.  Dental exams once or twice a year.  Routine eye exams. Ask your health care provider how often you should have your eyes checked.  Personal lifestyle choices, including: ? Daily care of your teeth and gums. ? Regular physical activity. ? Eating a healthy diet. ? Avoiding tobacco and drug use. ? Limiting alcohol use. ? Practicing safe sex. ? Taking vitamin and mineral supplements as recommended by your health care provider. What happens during an annual well check? The services and screenings done by your health care provider during your annual well check will depend on your age, overall health, lifestyle risk factors, and family history of disease. Counseling Your health care provider may ask you questions about your:  Alcohol use.  Tobacco use.  Drug use.  Emotional well-being.  Home and relationship well-being.  Sexual activity.  Eating habits.  Work and work Statistician.  Method of birth control.  Menstrual cycle.  Pregnancy history.  Screening You may have the following tests or measurements:  Height, weight, and BMI.  Diabetes screening. This is done by checking your blood sugar (glucose) after you have not eaten for a while (fasting).  Blood pressure.  Lipid and cholesterol levels. These may be checked every 5 years starting at age 66.  Skin check.  Hepatitis C blood test.  Hepatitis B blood test.  Sexually transmitted disease (STD) testing.  BRCA-related cancer screening. This may be done if you have a family history of breast, ovarian, tubal, or peritoneal cancers.  Pelvic exam and Pap test. This may be done every 3 years starting at age 40. Starting at age 59, this may be done every 5  years if you have a Pap test in combination with an HPV test.  Discuss your test results, treatment options, and if necessary, the need for more tests with your health care provider. Vaccines Your health care provider may recommend certain vaccines, such as:  Influenza vaccine. This is recommended every year.  Tetanus, diphtheria, and acellular pertussis (Tdap, Td) vaccine. You may need a Td booster every 10 years.  Varicella vaccine. You may need this if you have not been vaccinated.  HPV vaccine. If you are 69 or younger, you may need three doses over 6 months.  Measles, mumps, and rubella (MMR) vaccine. You may need at least one dose of MMR. You may also need a second dose.  Pneumococcal 13-valent conjugate (PCV13) vaccine. You may need this if you have certain conditions and were not previously vaccinated.  Pneumococcal polysaccharide (PPSV23) vaccine. You may need one or two doses if you smoke cigarettes or if you have certain conditions.  Meningococcal vaccine. One dose is recommended if you are age 27-21 years and a first-year college student living in a residence hall, or if you have one of several medical conditions. You may also need additional booster doses.  Hepatitis A vaccine. You may need this if you have certain conditions or if you travel or work in places where you may be exposed to hepatitis A.  Hepatitis B vaccine. You may need this if you have certain conditions or if you travel or work in places where you may be exposed to hepatitis B.  Haemophilus influenzae type b (Hib) vaccine. You may need this if  you have certain risk factors.  Talk to your health care provider about which screenings and vaccines you need and how often you need them. This information is not intended to replace advice given to you by your health care provider. Make sure you discuss any questions you have with your health care provider. Document Released: 09/10/2001 Document Revised: 04/03/2016  Document Reviewed: 05/16/2015 Elsevier Interactive Patient Education  2018 [employer].  Contraception Choices Contraception, also called birth control, means things to use or ways to try not to get pregnant. Hormonal birth control This kind of birth control uses hormones. Here are some types of hormonal birth control:  A tube that is put under skin of the arm (implant). The tube can stay in for as long as 3 years.  Shots to get every 3 months (injections).  Pills to take every day (birth control pills).  A patch to change 1 time each week for 3 weeks (birth control patch). After that, the patch is taken off for 1 week.  A ring to put in the vagina. The ring is left in for 3 weeks. Then it is taken out of the vagina for 1 week. Then a new ring is put in.  Pills to take after unprotected sex (emergency birth control pills).  Barrier birth control Here are some types of barrier birth control:  A thin covering that is put on the penis before sex (female condom). The covering is thrown away after sex.  A soft, loose covering that is put in the vagina before sex (female condom). The covering is thrown away after sex.  A rubber bowl that sits over the cervix (diaphragm). The bowl must be made for you. The bowl is put into the vagina before sex. The bowl is left in for 6-8 hours after sex. It is taken out within 24 hours.  A small, soft cup that fits over the cervix (cervical cap). The cup must be made for you. The cup can be left in for 6-8 hours after sex. It is taken out within 48 hours.  A sponge that is put into the vagina before sex. It must be left in for at least 6 hours after sex. It must be taken out within 30 hours. Then it is thrown away.  A chemical that kills or stops sperm from getting into the uterus (spermicide). It may be a pill, cream, jelly, or foam to put in the vagina. The chemical should be used at least 10-15 minutes before sex.  IUD (intrauterine) birth  control An IUD is a small, T-shaped piece of plastic. It is put inside the uterus. There are two kinds:  Hormone IUD. This kind can stay in for 3-5 years.  Copper IUD. This kind can stay in for 10 years.  Permanent birth control Here are some types of permanent birth control:  Surgery to block the fallopian tubes.  Having an insert put into each fallopian tube.  Surgery to tie off the tubes that carry sperm (vasectomy).  Natural planning birth control Here are some types of natural planning birth control:  Not having sex on the days the woman could get pregnant.  Using a calendar: ? To keep track of the length of each period. ? To find out what days pregnancy can happen. ? To plan to not have sex on days when pregnancy can happen.  Watching for symptoms of ovulation and not having sex during ovulation. One way the woman can check for ovulation is to  check her temperature.  Waiting to have sex until after ovulation.  Summary  Contraception, also called birth control, means things to use or ways to try not to get pregnant.  Hormonal methods of birth control include implants, injections, pills, patches, vaginal rings, and emergency birth control pills.  Barrier methods of birth control can include female condoms, female condoms, diaphragms, cervical caps, sponges, and spermicides.  There are two types of IUD (intrauterine device) birth control. An IUD can be put in a woman's uterus to prevent pregnancy for 3-5 years.  Permanent sterilization can be done through a procedure for males, females, or both.  Natural planning methods involve not having sex on the days when the woman could get pregnant. This information is not intended to replace advice given to you by your health care provider. Make sure you discuss any questions you have with your health care provider. Document Released: 05/12/2009 Document Revised: 07/25/2016 Document Reviewed: 07/25/2016 Elsevier Interactive  Patient Education  2017 [employer].

## 2017-12-16 NOTE — Progress Notes (Signed)
ANNUAL PREVENTATIVE CARE GYN  ENCOUNTER NOTE  Subjective:       Shelby Long is a 22 y.o. G67P0101 female here for a routine annual gynecologic exam.  Current complaints: 1.  Weight gain  Questions if weight gain is linked to OCPs and would like to discussed contraception options. Reports weight gain of approximately 30-40 pounds over the last year.   Denies difficulty breathing or respiratory distress, chest pain, abdominal pain, vaginal bleeding, dysuria, and leg pain or swelling.   Declines STI testing.   Gynecologic History  No LMP recorded. (Menstrual status: Oral contraceptives).  Contraception: OCP (estrogen/progesterone)  Last Pap: 10/2015. Results were: normal  Obstetric History  OB History  Gravida Para Term Preterm AB Living  SAB TAB Ectopic Multiple Live Births          1    # Outcome Date GA Lbr Len/2nd Weight Sex Delivery Anes PTL Lv  1 Preterm      CS-LTranv  N LIV     Complications: Failure to Progress in First Stage, Severe preeclampsia    Past Medical History:  Diagnosis Date  . Medical history non-contributory   . PONV (postoperative nausea and vomiting)   . Preeclampsia    Resolved after birth of her daughter     Past Surgical History:  Procedure Laterality Date  . CESAREAN SECTION    . CYST EXCISION N/A    on tailbone  . ESOPHAGOGASTRODUODENOSCOPY (EGD) WITH PROPOFOL N/A 03/17/2017   Procedure: ESOPHAGOGASTRODUODENOSCOPY (EGD) WITH PROPOFOL;  Surgeon: Christena Deem, MD;  Location: [facility] ENDOSCOPY;  Service: Endoscopy;  Laterality: N/A;  . WISDOM TOOTH EXTRACTION  2015    Current Outpatient Medications on File Prior to Visit  Medication Sig Dispense Refill  . norethindrone-ethinyl estradiol (LOESTRIN 1/20, 21,) 1-20 MG-MCG tablet Take 1 tablet by mouth daily. 3 Package 2  . EPINEPHrine (EPIPEN 2-PAK) 0.3 mg/0.3 mL IJ SOAJ injection Inject into the muscle.     No current facility-administered medications on file prior to  visit.     Allergies  Allergen Reactions  . Other Anaphylaxis    PEANUTS  . Shellfish-Derived Products Anaphylaxis  . Sulfa Antibiotics Anaphylaxis and Swelling  . Clindamycin/Lincomycin Swelling    Swelling to patient's tongue and lips. Patient later stated she had trouble breathing.   Marland Kitchen Penicillins Swelling  . Vancomycin Swelling    Social History   Socioeconomic History  . Marital status: Single    Spouse name: Not on file  . Number of children: Not on file  . Years of education: Not on file  . Highest education level: Not on file  Occupational History  . Not on file  Social Needs  . Financial resource strain: Not on file  . Food insecurity:    Worry: Not on file    Inability: Not on file  . Transportation needs:    Medical: Not on file    Non-medical: Not on file  Tobacco Use  . Smoking status: Never Smoker  . Smokeless tobacco: Never Used  Substance and Sexual Activity  . Alcohol use: Yes    Comment: occasional  . Drug use: No  . Sexual activity: Yes    Birth control/protection: None  Lifestyle  . Physical activity:    Days per week: Not on file    Minutes per session: Not on file  . Stress: Not on file  Relationships  . Social connections:    Talks  on phone: Not on file    Gets together: Not on file    Attends religious service: Not on file    Active member of club or organization: Not on file    Attends meetings of clubs or organizations: Not on file    Relationship status: Not on file  . Intimate partner violence:    Fear of current or ex partner: Not on file    Emotionally abused: Not on file    Physically abused: Not on file    Forced sexual activity: Not on file  Other Topics Concern  . Not on file  Social History Narrative  . Not on file    Family History  Problem Relation Age of Onset  . Sjogren's syndrome Mother   . Breast cancer Maternal Grandmother   . Parkinson's disease Maternal Grandfather     The following portions of the  patient's history were reviewed and updated as appropriate: allergies, current medications, past family history, past medical history, past social history, past surgical history and problem list.  Review of Systems  ROS negative except as noted above. Information obtained from patient.    Objective:   BP 120/80   Pulse 92   Ht  (1.702 m)   Wt 206 lb 3 oz (93.5 kg)   BMI 32.29 kg/m    CONSTITUTIONAL: Well-developed, well-nourished female in no acute distress.   PSYCHIATRIC: Normal mood and affect. Normal behavior. Normal judgment and thought content.  NEUROLGIC: Alert and oriented to person, place, and time. Normal muscle tone coordination. No cranial nerve deficit noted.  HENT:  Normocephalic, atraumatic, External right and left ear normal.   EYES: Conjunctivae and EOM are normal. Pupils are equal and round.   NECK: Normal range of motion, supple, no masses.  Normal thyroid.   SKIN: Skin is warm and dry. No rash noted. Not diaphoretic. No erythema. No pallor.  CARDIOVASCULAR: Normal heart rate noted, regular rhythm, no murmur.  RESPIRATORY: Clear to auscultation bilaterally. Effort and breath sounds normal, no problems with respiration noted.  BREASTS: Symmetric in size. No masses, skin changes, nipple drainage, or lymphadenopathy.  ABDOMEN: Soft, normal bowel sounds, no distention noted.  No tenderness, rebound or guarding.   PELVIC:  External Genitalia: Normal  Vagina: Normal  Cervix: Normal  Uterus: Normal  Adnexa: Normal  MUSCULOSKELETAL: Normal range of motion. No tenderness.  No cyanosis, clubbing, or edema.  2+ distal pulses.  LYMPHATIC: No Axillary, Supraclavicular, or Inguinal Adenopathy.  Assessment:   Annual gynecologic examination 22 y.o.   Contraception: OCP (estrogen/progesterone)   Obesity 1   Problem List Items Addressed This Visit    None    Visit Diagnoses    Well woman exam       Relevant Orders   Amb ref to Medical Nutrition  Therapy-MNT   CBC   Comprehensive metabolic panel   Thyroid Panel With TSH   Hemoglobin A1c   Weight gain       Relevant Orders   Amb ref to Medical Nutrition Therapy-MNT   Thyroid Panel With TSH   BMI 32.0-32.9,adult       Relevant Orders   Amb ref to Medical Nutrition Therapy-MNT   Thyroid Panel With TSH   Hemoglobin A1c      Plan:   Pap: Not needed  Labs: See orders   Routine preventative health maintenance measures emphasized: Exercise/Diet/Weight control, Tobacco Warnings, Alcohol/Substance use risks, Stress Management, Peer Pressure Issues and Safe Sex; see AVS  Reviewed all  forms of birth control options available including abstinence; fertility period awareness methods; over the counter/barrier methods; hormonal contraceptive medication including pill, patch, ring, injection,contraceptive implant; hormonal and nonhormonal IUDs. Risks and benefits reviewed.  Questions were answered.  Information was given to patient to review.   RTC x 1 year for Annual Exam or sooner if needed.    Gunnar Bulla, CNM [facility], [facility]

## 2017-12-16 NOTE — Progress Notes (Signed)
Pt is here for an annual exam.

## 2017-12-17 LAB — COMPREHENSIVE METABOLIC PANEL
ALK PHOS: 54 IU/L (ref 39–117)
ALT: 14 IU/L (ref 0–32)
AST: 15 IU/L (ref 0–40)
Albumin/Globulin Ratio: 1.6 (ref 1.2–2.2)
Albumin: 4.3 g/dL (ref 3.5–5.5)
BILIRUBIN TOTAL: 0.4 mg/dL (ref 0.0–1.2)
BUN/Creatinine Ratio: 13 (ref 9–23)
BUN: 12 mg/dL (ref 6–20)
CO2: 22 mmol/L (ref 20–29)
CREATININE: 0.91 mg/dL (ref 0.57–1.00)
Calcium: 9.5 mg/dL (ref 8.7–10.2)
Chloride: 104 mmol/L (ref 96–106)
GFR calc Af Amer: 103 mL/min/{1.73_m2} (ref >59)
GFR calc non Af Amer: 89 mL/min/{1.73_m2} (ref >59)
GLOBULIN, TOTAL: 2.7 g/dL (ref 1.5–4.5)
GLUCOSE: 77 mg/dL (ref 65–99)
POTASSIUM: 4.4 mmol/L (ref 3.5–5.2)
SODIUM: 139 mmol/L (ref 134–144)
Total Protein: 7 g/dL (ref 6.0–8.5)

## 2017-12-17 LAB — CBC
HEMATOCRIT: 38.8 % (ref 34.0–46.6)
Hemoglobin: 13.3 g/dL (ref 11.1–15.9)
MCH: 31.1 pg (ref 26.6–33.0)
MCHC: 34.3 g/dL (ref 31.5–35.7)
MCV: 91 fL (ref 79–97)
Platelets: 261 10*3/uL (ref 150–450)
RBC: 4.27 x10E6/uL (ref 3.77–5.28)
RDW: 13.8 % (ref 12.3–15.4)
WBC: 6.2 10*3/uL (ref 3.4–10.8)

## 2017-12-17 LAB — HEMOGLOBIN A1C
ESTIMATED AVERAGE GLUCOSE: 91 mg/dL
Hgb A1c MFr Bld: 4.8 % (ref 4.8–5.6)

## 2017-12-17 LAB — THYROID PANEL WITH TSH
Free Thyroxine Index: 2.4 (ref 1.2–4.9)
T3 Uptake Ratio: 27 % (ref 24–39)
T4, Total: 9 ug/dL (ref 4.5–12.0)
TSH: 1.85 u[IU]/mL (ref 0.450–4.500)

## 2018-01-19 ENCOUNTER — Ambulatory Visit: Payer: 59 | Admitting: Dietician

## 2018-01-27 ENCOUNTER — Telehealth: Payer: Self-pay | Admitting: Certified Nurse Midwife

## 2018-01-27 NOTE — Telephone Encounter (Signed)
The patient has an appointment tomorrow, but she thinks she may have a partially retained tampon in vaginal vault and would like some feedback on if she needs to come in sooner than tomorrow morning.  Her callback number is [phone], please advise, thanks.

## 2018-01-28 ENCOUNTER — Telehealth: Payer: Self-pay

## 2018-01-28 ENCOUNTER — Ambulatory Visit (INDEPENDENT_AMBULATORY_CARE_PROVIDER_SITE_OTHER): Payer: 59 | Admitting: Certified Nurse Midwife

## 2018-01-28 ENCOUNTER — Encounter: Payer: Self-pay | Admitting: Certified Nurse Midwife

## 2018-01-28 VITALS — BP 122/76 | HR 59 | Ht 70.0 in | Wt 208.4 lb

## 2018-01-28 DIAGNOSIS — Z0389 Encounter for observation for other suspected diseases and conditions ruled out: Secondary | ICD-10-CM | POA: Diagnosis not present

## 2018-01-28 DIAGNOSIS — T192XXA Foreign body in vulva and vagina, initial encounter: Secondary | ICD-10-CM

## 2018-01-28 NOTE — Patient Instructions (Signed)
Toxic Shock Syndrome °Toxic shock syndrome (TSS) is a life-threatening condition that can result from infections with certain bacteria. When these bacteria infect the body, they release chemicals (toxins) into the bloodstream that can damage the vital organs. TSS can affect healthy or ill people of any age or gender. It is most common in menstruating women. °TSS can result in: °· Shock. °· Heart failure. °· Liver failure. °· Kidney failure. °· Lung damage. °· Respiratory failure. °· Rash and ulcers. °· Blood clotting and bleeding problems. °TSS is a medical emergency that usually requires hospitalization. °CAUSES °This condition is often caused by one of these types of bacteria: °·  Staphylococcus aureus. °·  Streptococcus pyogenes. °TSS occurs after these bacteria enter the body through breaks, cracks, or wounds in the skin or other body tissue. It often occurs when these bacteria grow on a tampon and infect a woman through the vagina. °RISK FACTORS °This condition is more likely to develop in: °· People who have had an injury that breaks the skin. °· People who have recently had surgery. °· People who have a Staphylococcus aureus skin infection. °· People who have foreign material inside the body, such as packings that are used to stop nosebleeds. °· People who have a viral infection. °· People who have diabetes. °· Women who use high-absorbency tampons. °· Women who leave tampons inserted longer than recommended. °· Women who use diaphragms, sponges, or cervical caps. °· Women who have recently given birth. °· Women who have recently had a procedure that involves the sexual organs. °SYMPTOMS °Symptoms of this condition may include: °· High fever. °· Vomiting. °· Diarrhea. °· Sunburn-like rash. °· Increased heart rate. °· Shock. °· Muscle aches and pains. °· Headaches. °· Sore throat. °· Redness of the mouth, throat, or eyes. °· Confusion. °· Shortness of breath. °· Swelling or peeling of the skin on the palms of  the hands or soles of the feet. °There may or may not be signs of infection at the site where the bacteria entered your body. °DIAGNOSIS °This condition may be diagnosed based on a physical exam and other tests. °Tests for men and women include: °· Blood tests. °· Urine tests. °· Blood, wound, and skin sample cultures. °· Cultures from the mucus (sputum) and throat. °· Imaging studies, such as X-rays, MRI, and CT scans. °· Lumbar puncture to check spinal fluid. °Tests that are done only in women include cultures from the vagina. °TREATMENT  °Hospitalization is usually required, even in mild cases. There is no way to know whether a person with early TSS will develop severe medical problems. °Common treatments for men and women include: °· Antibiotic medicines that are given through an IV tube. °· IV fluids and medicines that are given: °? To raise low blood pressure. °? To replace fluids and body salts (electrolytes) that are lost from vomiting or diarrhea. °· Dialysis, if the kidneys are severely affected. °· Removal of any foreign material in the body, such as nasal packings. °Treatment for women includes immediate removal of any tampon, diaphragm, sponge, or cervical cap, if this applies. °In rare cases, men and women may need treatments including: °· Surgery to remove infected tissue. °· A kidney transplant. °HOME CARE INSTRUCTIONS °· Take over-the-counter and prescription medicines only as told by your health care provider. °· If you were prescribed antibiotic medicine, take it as told by your health care provider. Do not stop taking the antibiotic even if you start to feel better or your condition improves. °· (  Women) Do not use tampons if you have had TSS. °PREVENTION °For men and women: °· Follow instructions from your health care provider about proper wound care to prevent infection. °· Do not use gauze packing for a nosebleed. °· Follow instructions for proper care of tattoos and piercings. °For  women: °· Wash your hands with soap and warm water before you insert or remove a tampon, diaphragm, sponge, or cervical cap. °· Use tampons only as told by your health care provider. Follow guidelines for recommended tampon use. °· If you develop a fever or a rash while using a tampon, remove the tampon immediately. °· Choose tampons that have the lowest absorbency that meets your needs. Avoid using super-absorbent tampons. °· Alternate the use of tampons with sanitary napkins or pads during your period. °· Change tampons every 4-8 hours. °· When inserting a tampon, be very careful not to scratch the vaginal lining. Plastic applicators may increase risk because of the sharp edges. If your vagina seems dry, use a water-soluble lubricating jelly to insert the tampon. °· Do not use tampons between periods. °· Do not use tampons that are made with synthetic materials. Use only 100% cotton tampons. °· Do not leave a tampon inserted overnight if you plan to sleep more than 6-8 hours. Put in a fresh tampon before you sleep and remove it immediately upon waking, or use sanitary napkins or pads at night. °· Do not leave in diaphragms, sponges, or cervical caps longer than 12 hours or as told by your health care provider. °· Do not use tampons, a sponge, a cervical cap, or a diaphragm for 12 weeks after delivering a baby. °SEEK MEDICAL CARE IF: °· You are a woman who has delivered a baby within the past 12 weeks and you are exposed to someone who has a confirmed case of strep throat. °SEEK IMMEDIATE MEDICAL CARE IF: °· You have a fever, chills, vomiting, or diarrhea. °· You have a rash or severe bruising. °· You have severe dizziness or you faint. °· You have a seizure. °· You have redness, pain, or swelling around a wound. °· You have blood or pus coming from a wound. °· You have chest pain or difficulty breathing. °· You have sudden or severe pain in your abdomen. °· You have sudden bleeding from your nose or gums. °· You  have red streaks on your skin that come from an injury, a piercing, or a tattoo. °· Your symptoms return after your condition improves. °This information is not intended to replace advice given to you by your health care provider. Make sure you discuss any questions you have with your health care provider. °Document Released: 10/05/2002 Document Revised: 04/05/2015 Document Reviewed: 10/10/2014 °Elsevier Interactive Patient Education © 2017 [employer] ° °

## 2018-01-28 NOTE — Telephone Encounter (Signed)
Pt has appointment today.  

## 2018-01-28 NOTE — Progress Notes (Signed)
GYN ENCOUNTER NOTE  Subjective:       Shelby Long is a 22 y.o. 661P0101 female is here for gynecologic evaluation of the following issues:  1. Possible retained portion of tampon. States when removing tampon,potential top portion of the tampon came off less than 48 hours ago. She denies fever,abdominal discharge,tenderness,and foul odor.   Gynecologic History Patient's last menstrual period was 01/24/2018 (exact date). Contraception: OCP (estrogen/progesterone)   Obstetric History OB History  Gravida Para Term Preterm AB Living  1 1   1   1   SAB TAB Ectopic Multiple Live Births          1    # Outcome Date GA Lbr Len/2nd Weight Sex Delivery Anes PTL Lv  1 Preterm      CS-LTranv  N LIV     Complications: Failure to Progress in First Stage, Severe preeclampsia    Past Medical History:  Diagnosis Date  . Medical history non-contributory   . PONV (postoperative nausea and vomiting)   . Preeclampsia    Resolved after birth of her daughter     Past Surgical History:  Procedure Laterality Date  . CESAREAN SECTION    . CYST EXCISION N/A    on tailbone  . ESOPHAGOGASTRODUODENOSCOPY (EGD) WITH PROPOFOL N/A 03/17/2017   Procedure: ESOPHAGOGASTRODUODENOSCOPY (EGD) WITH PROPOFOL;  Surgeon: Christena DeemSkulskie, Martin U, MD;  [facility];  Service: Endoscopy;  Laterality: N/A;  . WISDOM TOOTH EXTRACTION  2015    Current Outpatient Medications on File Prior to Visit  Medication Sig Dispense Refill  . EPINEPHrine (EPIPEN 2-PAK) 0.3 mg/0.3 mL IJ SOAJ injection Inject into the muscle.    . norethindrone-ethinyl estradiol (LOESTRIN 1/20, 21,) 1-20 MG-MCG tablet Take 1 tablet by mouth daily. (Patient not taking: Reported on 01/28/2018) 3 Package 2   No current facility-administered medications on file prior to visit.     Allergies  Allergen Reactions  . Other Anaphylaxis    PEANUTS  . Shellfish-Derived Products Anaphylaxis  . Sulfa Antibiotics Anaphylaxis and Swelling  .  Clindamycin/Lincomycin Swelling    Swelling to patient's tongue and lips. Patient later stated she had trouble breathing.   Marland Kitchen. Penicillins Swelling  . Vancomycin Swelling    Social History   Socioeconomic History  . Marital status: Single    Spouse name: Not on file  . Number of children: Not on file  . Years of education: Not on file  . Highest education level: Not on file  Occupational History  . Not on file  Social Needs  . Financial resource strain: Not on file  . Food insecurity:    Worry: Not on file    Inability: Not on file  . Transportation needs:    Medical: Not on file    Non-medical: Not on file  Tobacco Use  . Smoking status: Never Smoker  . Smokeless tobacco: Never Used  Substance and Sexual Activity  . Alcohol use: Yes    Comment: occasional  . Drug use: No  . Sexual activity: Yes    Birth control/protection: None  Lifestyle  . Physical activity:    Days per week: Not on file    Minutes per session: Not on file  . Stress: Not on file  Relationships  . Social connections:    Talks on phone: Not on file    Gets together: Not on file    Attends religious service: Not on file    Active member of club or organization: Not on file  Attends meetings of clubs or organizations: Not on file    Relationship status: Not on file  . Intimate partner violence:    Fear of current or ex partner: Not on file    Emotionally abused: Not on file    Physically abused: Not on file    Forced sexual activity: Not on file  Other Topics Concern  . Not on file  Social History Narrative  . Not on file    Family History  Problem Relation Age of Onset  . Sjogren's syndrome Mother   . Breast cancer Maternal Grandmother   . Parkinson's disease Maternal Grandfather     The following portions of the patient's history were reviewed and updated as appropriate: allergies, current medications, past family history, past medical history, past social history, past surgical  history and problem list.  Review of Systems Review of Systems - Negative except as mentioned in HPI. Review of Systems - General ROS: negative for - chills, fatigue, fever, hot flashes, malaise or night sweats Hematological and Lymphatic ROS: negative for - bleeding problems or swollen lymph nodes Gastrointestinal ROS: negative for - abdominal pain, blood in stools, change in bowel habits and nausea/vomiting Musculoskeletal ROS: negative for - joint pain, muscle pain or muscular weakness Genito-Urinary ROS: negative for - change in menstrual cycle, dysmenorrhea, dyspareunia, dysuria, genital discharge, genital ulcers, hematuria, incontinence, irregular/heavy menses, nocturia or pelvic pain.  Objective:   BP 122/76   Pulse (!) 59   Ht 5\' 10"  (1.778 m)   Wt 208 lb 7 oz (94.5 kg)   LMP 01/24/2018 (Exact Date)   BMI 29.91 kg/m  CONSTITUTIONAL: Well-developed, well-nourished female in no acute distress.  HENT:  Normocephalic, atraumatic.  NECK: Normal range of motion. SKIN: Skin is warm and dry. No rash noted. Not diaphoretic. No erythema. No pallor. NEUROLGIC: Alert and oriented to person, place, and time. PSYCHIATRIC: Normal mood and affect. Normal behavior. Normal judgment and thought content. CARDIOVASCULAR:Not Examined RESPIRATORY: Not Examined BREASTS: Not Examined ABDOMEN: Soft, non distended; Non tender.  No Organomegaly. PELVIC:  External Genitalia: Normal  BUS: Normal  Vagina: Normal, no odor or tenderness,no tampon material visualized  Cervix: Normal,no tampon material visualized,small amount of bleeding from                period noted from cycle ,negative CMT  Uterus: Normal size, shape,consistency, mobile  Adnexa: Normal  RV: Normal   Bladder: Nontender MUSCULOSKELETAL: Normal range of motion. No tenderness.  No cyanosis, clubbing, or edema.     Assessment:  Possible retained products of tampon.   Plan:  Red flag symptoms reviewed and when to call. Follow up  as needed.   Shanika Creacy,SNM Doreene Burke, CNM

## 2018-01-28 NOTE — Progress Notes (Signed)
Pt states she thinks a piece of tampon is still in vagina.

## 2018-05-18 ENCOUNTER — Encounter: Payer: Self-pay | Admitting: Certified Nurse Midwife

## 2018-05-18 ENCOUNTER — Ambulatory Visit (INDEPENDENT_AMBULATORY_CARE_PROVIDER_SITE_OTHER): Payer: 59 | Admitting: Certified Nurse Midwife

## 2018-05-18 VITALS — BP 108/75 | HR 76 | Ht 70.0 in | Wt 216.0 lb

## 2018-05-18 DIAGNOSIS — Z98891 History of uterine scar from previous surgery: Secondary | ICD-10-CM | POA: Diagnosis not present

## 2018-05-18 DIAGNOSIS — N941 Unspecified dyspareunia: Secondary | ICD-10-CM | POA: Diagnosis not present

## 2018-05-18 DIAGNOSIS — Z842 Family history of other diseases of the genitourinary system: Secondary | ICD-10-CM

## 2018-05-18 DIAGNOSIS — N946 Dysmenorrhea, unspecified: Secondary | ICD-10-CM | POA: Diagnosis not present

## 2018-05-18 MED ORDER — IBUPROFEN 800 MG PO TABS: 800.0000 mg | ORAL_TABLET | Freq: Three times a day (TID) | ORAL | 1 refills | Status: AC | PRN

## 2018-05-18 MED ORDER — NORETHINDRONE ACET-ETHINYL EST 1-20 MG-MCG PO TABS: 1.0000 | ORAL_TABLET | Freq: Every day | ORAL | 11 refills | Status: DC

## 2018-05-18 NOTE — Patient Instructions (Signed)
Endometriosis Endometriosis is a condition in which the tissue that lines the uterus (endometrium) grows outside of its normal location. The tissue may grow in many locations close to the uterus, but it commonly grows on the ovaries, fallopian tubes, vagina, or bowel. When the uterus sheds the endometrium every menstrual cycle, there is bleeding wherever the endometrial tissue is located. This can cause pain because blood is irritating to tissues that are not normally exposed to it. What are the causes? The cause of endometriosis is not known. What increases the risk? You may be more likely to develop endometriosis if you:  Have a family history of endometriosis.  Have never given birth.  Started your period at age 10 or younger.  Have high levels of estrogen in your body.  Were exposed to a certain medicine (diethylstilbestrol) before you were born (in utero).  Had low birth weight.  Were born as a twin, triplet, or other multiple.  Have a BMI of less than 25. BMI is an estimate of body fat and is calculated from height and weight.  What are the signs or symptoms? Often, there are no symptoms of this condition. If you do have symptoms, they may:  Vary depending on where your endometrial tissue is growing.  Occur during your menstrual period (most common) or midcycle.  Come and go, or you may go months with no symptoms at all.  Stop with menopause.  Symptoms may include:  Pain in the back or abdomen.  Heavier bleeding during periods.  Pain during sex.  Painful bowel movements.  Infertility.  Pelvic pain.  Bleeding more than once a month.  How is this diagnosed? This condition is diagnosed based on your symptoms and a physical exam. You may have tests, such as:  Blood tests and urine tests. These may be done to help rule out other possible causes of your symptoms.  Ultrasound, to look for abnormal tissues.  An X-ray of the lower bowel (barium enema).  An  ultrasound that is done through the vagina (transvaginally).  CT scan.  MRI.  Laparoscopy. In this procedure, a lighted, pencil-sized instrument called a laparoscope is inserted into your abdomen through an incision. The laparoscope allows your health care provider to look at the organs inside your body and check for abnormal tissue to confirm the diagnosis. If abnormal tissue is found, your health care provider may remove a small piece of tissue (biopsy) to be examined under a microscope.  How is this treated? Treatment for this condition may include:  Medicines to relieve pain, such as NSAIDs.  Hormone therapy. This involves using artificial (synthetic) hormones to reduce endometrial tissue growth. Your health care provider may recommend using a hormonal form of birth control, or other medicines.  Surgery. This may be done to remove abnormal endometrial tissue. ? In some cases, tissue may be removed using a laparoscope and a laser (laparoscopic laser treatment). ? In severe cases, surgery may be done to remove the fallopian tubes, uterus, and ovaries (hysterectomy).  Follow these instructions at home:  Take over-the-counter and prescription medicines only as told by your health care provider.  Do not drive or use heavy machinery while taking prescription pain medicine.  Try to avoid activities that cause pain, including sexual activity.  Keep all follow-up visits as told by your health care provider. This is important. Contact a health care provider if:  You have pain in the area between your hip bones (pelvic area) that occurs: ? Before, during, or   after your period. ? In between your period and gets worse during your period. ? During or after sex. ? With bowel movements or urination, especially during your period.  You have problems getting pregnant.  You have a fever. Get help right away if:  You have severe pain that does not get better with medicine.  You have severe  nausea and vomiting, or you cannot eat without vomiting.  You have pain that affects only the lower, right side of your abdomen.  You have abdominal pain that gets worse.  You have abdominal swelling.  You have blood in your stool. This information is not intended to replace advice given to you by your health care provider. Make sure you discuss any questions you have with your health care provider. Document Released: 07/12/2000 Document Revised: 04/19/2016 Document Reviewed: 12/16/2015 Elsevier Interactive Patient Education  2018 [employer] Dysmenorrhea Dysmenorrhea means painful cramps during your period (menstrual period). You will have pain in your lower belly (abdomen). The pain is caused by the tightening (contracting) of the muscles of the womb (uterus). The pain may be mild or very bad. With this condition, you may:  Have a headache.  Feel sick to your stomach (nauseous).  Throw up (vomit).  Have lower back pain.  Follow these instructions at home: Helping pain and cramping  Put heat on your lower back or belly when you have pain or cramps. Use the heat source that your doctor tells you to use. ? Place a towel between your skin and the heat. ? Leave the heat on for 20-30 minutes. ? Remove the heat if your skin turns bright red. This is especially important if you cannot feel pain, heat, or cold. ? Do not have a heating pad on during sleep.  Do aerobic exercises. These include walking, swimming, or biking. These may help with cramps.  Massage your lower back or belly. This may help lessen pain. General instructions  Take over-the-counter and prescription medicines only as told by your doctor.  Do not drive or use heavy machinery while taking prescription pain medicine.  Avoid alcohol and caffeine during and right before your period. These can make cramps worse.  Do not use any products that have nicotine or tobacco. These include cigarettes and e-cigarettes. If  you need help quitting, ask your doctor.  Keep all follow-up visits as told by your doctor. This is important. Contact a doctor if:  You have pain that gets worse.  You have pain that does not get better with medicine.  You have pain during sex.  You feel sick to your stomach or you throw up during your period, and medicine does not help. Get help right away if:  You pass out (faint). Summary  Dysmenorrhea means painful cramps during your period (menstrual period).  Put heat on your lower back or belly when you have pain or cramps.  Do exercises like walking, swimming, or biking to help with cramps.  Contact a doctor if you have pain during sex. This information is not intended to replace advice given to you by your health care provider. Make sure you discuss any questions you have with your health care provider. Document Released: 10/11/2008 Document Revised: 08/01/2016 Document Reviewed: 08/01/2016 Elsevier Interactive Patient Education  2017 [employer].

## 2018-05-18 NOTE — Progress Notes (Signed)
GYN ENCOUNTER NOTE  Subjective:       Shelby Long is a 22 y.o. G81P0101 female is here for gynecologic evaluation of the following issues:  1. Dysmenorrhea 2. History of cesarean section 3. Family history of endometriosis  Endorses back pain the day before and first day of menses for the last three months relieved by heat; not taking Motrin. Symptoms continue the entire cycle, but are "bearable". Reports pain with initiation of intercourse; not worse with insertion or thrusting.   Denies difficulty breathing or respiratory distress, chest pain, abdominal pain, excessive vaginal bleeding, vaginal itching or increased discharge, dysuria, and leg pain or swelling.     Gynecologic History  Patient's last menstrual period was 05/14/2018 (exact date). Period Cycle (Days): 28 Period Duration (Days): 6 Period Pattern: Regular Menstrual Flow: Heavy Menstrual Control: Tampon Dysmenorrhea: (!) Moderate Dysmenorrhea Symptoms: Cramping  Contraception: condoms  Last Pap: 10/2015. Results were: normal  Obstetric History  OB History  Gravida Para Term Preterm AB Living  1 1   1   1   SAB TAB Ectopic Multiple Live Births          1    # Outcome Date GA Lbr Len/2nd Weight Sex Delivery Anes PTL Lv  1 Preterm      CS-LTranv  N LIV     Complications: Failure to Progress in First Stage, Severe preeclampsia    Past Medical History:  Diagnosis Date  . Medical history non-contributory   . PONV (postoperative nausea and vomiting)   . Preeclampsia    Resolved after birth of her daughter     Past Surgical History:  Procedure Laterality Date  . CESAREAN SECTION    . CYST EXCISION N/A    on tailbone  . ESOPHAGOGASTRODUODENOSCOPY (EGD) WITH PROPOFOL N/A 03/17/2017   Procedure: ESOPHAGOGASTRODUODENOSCOPY (EGD) WITH PROPOFOL;  Surgeon: Christena Deem, MD;  Location[facility] ENDOSCOPY;  Service: Endoscopy;  Laterality: N/A;  . WISDOM TOOTH EXTRACTION  2015    Current Outpatient  Medications on File Prior to Visit  Medication Sig Dispense Refill  . EPINEPHrine (EPIPEN 2-PAK) 0.3 mg/0.3 mL IJ SOAJ injection Inject into the muscle.     No current facility-administered medications on file prior to visit.     Allergies  Allergen Reactions  . Other Anaphylaxis    PEANUTS  . Shellfish-Derived Products Anaphylaxis  . Sulfa Antibiotics Anaphylaxis and Swelling  . Clindamycin/Lincomycin Swelling    Swelling to patient's tongue and lips. Patient later stated she had trouble breathing.   Marland Kitchen Penicillins Swelling  . Vancomycin Swelling    Social History   Socioeconomic History  . Marital status: Single    Spouse name: Not on file  . Number of children: Not on file  . Years of education: Not on file  . Highest education level: Not on file  Occupational History  . Not on file  Social Needs  . Financial resource strain: Not on file  . Food insecurity:    Worry: Not on file    Inability: Not on file  . Transportation needs:    Medical: Not on file    Non-medical: Not on file  Tobacco Use  . Smoking status: Never Smoker  . Smokeless tobacco: Never Used  Substance and Sexual Activity  . Alcohol use: Yes    Comment: occasional  . Drug use: No  . Sexual activity: Yes    Birth control/protection: Condom  Lifestyle  . Physical activity:    Days per week: Not  on file    Minutes per session: Not on file  . Stress: Not on file  Relationships  . Social connections:    Talks on phone: Not on file    Gets together: Not on file    Attends religious service: Not on file    Active member of club or organization: Not on file    Attends meetings of clubs or organizations: Not on file    Relationship status: Not on file  . Intimate partner violence:    Fear of current or ex partner: Not on file    Emotionally abused: Not on file    Physically abused: Not on file    Forced sexual activity: Not on file  Other Topics Concern  . Not on file  Social History Narrative   . Not on file    Family History  Problem Relation Age of Onset  . Sjogren's syndrome Mother   . Breast cancer Maternal Grandmother   . Parkinson's disease Maternal Grandfather   . Ovarian cancer Neg Hx   . Colon cancer Neg Hx     The following portions of the patient's history were reviewed and updated as appropriate: allergies, current medications, past family history, past medical history, past social history, past surgical history and problem list.  Review of Systems  ROS negative except as noted above. Information obtained from patient.   Objective:   BP 108/75   Pulse 76   Ht 5\' 10"  (1.778 m)   Wt 216 lb (98 kg)   LMP 05/14/2018 (Exact Date)   BMI 30.99 kg/m    CONSTITUTIONAL: Well-developed, well-nourished female in no acute distress.   Physical exam: Pelvic exam declined by patient.   Assessment:   1. Dysmenorrhea  2. History of cesarean section  3. Family history of endometriosis  Plan:   Discussed dysmenorrhea management options.   Discussed endometriosis diagnostic criteria and treatment options.   Encouraged continuation of pelvic floor exercises per DPT.   Rx: Motrin and Loestrin, see orders.   Reviewed red flag symptoms and when to call.   RTC x 3-4 months for follow up or sooner if needed.    Gunnar Bulla, CNM [facility], [facility]

## 2018-08-18 ENCOUNTER — Ambulatory Visit (INDEPENDENT_AMBULATORY_CARE_PROVIDER_SITE_OTHER): Payer: 59 | Admitting: Certified Nurse Midwife

## 2018-08-18 ENCOUNTER — Encounter: Payer: Self-pay | Admitting: Certified Nurse Midwife

## 2018-08-18 ENCOUNTER — Other Ambulatory Visit: Payer: Self-pay

## 2018-08-18 VITALS — BP 111/82 | HR 81 | Ht 70.0 in | Wt 225.4 lb

## 2018-08-18 DIAGNOSIS — M549 Dorsalgia, unspecified: Secondary | ICD-10-CM | POA: Diagnosis not present

## 2018-08-18 DIAGNOSIS — R102 Pelvic and perineal pain: Secondary | ICD-10-CM

## 2018-08-18 DIAGNOSIS — N946 Dysmenorrhea, unspecified: Secondary | ICD-10-CM | POA: Diagnosis not present

## 2018-08-18 MED ORDER — CYCLOBENZAPRINE HCL 10 MG PO TABS: 10.0000 mg | ORAL_TABLET | Freq: Three times a day (TID) | ORAL | 2 refills | Status: DC | PRN

## 2018-08-18 NOTE — Progress Notes (Signed)
GYN ENCOUNTER NOTE  Subjective:       Shelby Long is a 23 y.o. 321P0101 female her for follow up visit regarding dysmenorrhea, pelvic pain, and dyspareunia.   Last seen in office on 05/18/2018. Reports single period since last office visit. Plans to restart OCPs after missing two (2) days.   Notes new onset constant back pain-minimal relief with home treatment measures.   Denies difficulty breathing or respiratory distress, chest pain, abdominal pain, excessive vaginal bleeding, dysuria, and leg pain or swelling.    Gynecologic History  Patient's last menstrual period was 06/15/2018.  Period Duration (Days): 4 Period Pattern: (!) Irregular Menstrual Flow: Light Menstrual Control: Tampon Dysmenorrhea: (!) Moderate Dysmenorrhea Symptoms: Cramping  Contraception: condoms and OCP (estrogen/progesterone)   Last Pap: 09/2015. Results were: normal  Obstetric History  OB History  Gravida Para Term Preterm AB Living  1 1   1   1   SAB TAB Ectopic Multiple Live Births          1    # Outcome Date GA Lbr Len/2nd Weight Sex Delivery Anes PTL Lv  1 Preterm 06/10/16   3 lb 11 oz (1.673 kg) F CS-LTranv  N LIV     Complications: Failure to Progress in First Stage, Severe preeclampsia    Past Medical History:  Diagnosis Date  . PONV (postoperative nausea and vomiting)   . Preeclampsia    Resolved after birth of her daughter     Past Surgical History:  Procedure Laterality Date  . CESAREAN SECTION    . CYST EXCISION N/A    on tailbone  . ESOPHAGOGASTRODUODENOSCOPY (EGD) WITH PROPOFOL N/A 03/17/2017   Procedure: ESOPHAGOGASTRODUODENOSCOPY (EGD) WITH PROPOFOL;  Surgeon: Christena DeemSkulskie, Martin U, MD;  Location[facility] ENDOSCOPY;  Service: Endoscopy;  Laterality: N/A;  . WISDOM TOOTH EXTRACTION  2015    Current Outpatient Medications on File Prior to Visit  Medication Sig Dispense Refill  . EPINEPHrine (EPIPEN 2-PAK) 0.3 mg/0.3 mL IJ SOAJ injection Inject into the muscle.    .  ibuprofen (ADVIL,MOTRIN) 800 MG tablet Take 1 tablet (800 mg total) by mouth every 8 (eight) hours as needed. 60 tablet 1  . norethindrone-ethinyl estradiol (LOESTRIN 1/20, 21,) 1-20 MG-MCG tablet Take 1 tablet by mouth daily. (Patient not taking: Reported on 08/18/2018) 1 Package 11   No current facility-administered medications on file prior to visit.     Allergies  Allergen Reactions  . Other Anaphylaxis    PEANUTS  . Shellfish-Derived Products Anaphylaxis  . Sulfa Antibiotics Anaphylaxis and Swelling  . Clindamycin/Lincomycin Swelling    Swelling to patient's tongue and lips. Patient later stated she had trouble breathing.   Marland Kitchen. Penicillins Swelling  . Vancomycin Swelling    Social History   Socioeconomic History  . Marital status: Single    Spouse name: Not on file  . Number of children: Not on file  . Years of education: Not on file  . Highest education level: Not on file  Occupational History  . Not on file  Social Needs  . Financial resource strain: Not on file  . Food insecurity:    Worry: Not on file    Inability: Not on file  . Transportation needs:    Medical: Not on file    Non-medical: Not on file  Tobacco Use  . Smoking status: Never Smoker  . Smokeless tobacco: Never Used  Substance and Sexual Activity  . Alcohol use: Yes    Comment: occasional  . Drug use:  No  . Sexual activity: Yes    Birth control/protection: Condom  Lifestyle  . Physical activity:    Days per week: Not on file    Minutes per session: Not on file  . Stress: Not on file  Relationships  . Social connections:    Talks on phone: Not on file    Gets together: Not on file    Attends religious service: Not on file    Active member of club or organization: Not on file    Attends meetings of clubs or organizations: Not on file    Relationship status: Not on file  . Intimate partner violence:    Fear of current or ex partner: Not on file    Emotionally abused: Not on file     Physically abused: Not on file    Forced sexual activity: Not on file  Other Topics Concern  . Not on file  Social History Narrative  . Not on file    Family History  Problem Relation Age of Onset  . Sjogren's syndrome Mother   . Breast cancer Maternal Grandmother   . Parkinson's disease Maternal Grandfather   . Ovarian cancer Neg Hx   . Colon cancer Neg Hx     The following portions of the patient's history were reviewed and updated as appropriate: allergies, current medications, past family history, past medical history, past social history, past surgical history and problem list.  Review of Systems  ROS negative except as noted above. Information obtained from patient.   Objective:   BP 111/82   Pulse 81   Ht 5\' 10"  (1.778 m)   Wt 225 lb 6.4 oz (102.2 kg)   LMP 06/15/2018   BMI 32.34 kg/m    CONSTITUTIONAL: Well-developed, well-nourished female in no acute distress.   PHYSICAL EXAM: Not indicated.   Assessment:   1. Dysmenorrhea   2. Pelvic pain   3. Back pain, unspecified back location, unspecified back pain laterality, unspecified chronicity   Plan:   Discussed treatment options including other OCPs, POP, progesterone IUD, and implant as well as not hormonal options like Orilissa.   Patient would like to stay on current OCP, add Flexeril, and follow up with pelvic floor physical therapy as previously suggested.   Rx: Flexeril, see orders.   Advised follow up with MD for laparoscopy and official diagnosis if treatment plan fails, patient verbalized understanding.   Reviewed red flag symptoms and when to call.   RTC x 3 months for ANNUAL EXAM with PAP or sooner if needed   Gunnar BullaJenkins Michelle Senica Crall, CNM [facility], [facility] 08/18/18 5:28 PM    A total of 15 minutes were spent face-to-face with the patient during this encounter and over half of that time dealt with counseling and coordination of care.

## 2018-08-18 NOTE — Progress Notes (Signed)
Patient here to follow up on dysmenorrhea, ran out of OCP 2 days ago but plans on restarting. Patient c/o still having constant back pain.

## 2018-08-18 NOTE — Patient Instructions (Addendum)
WE WOULD LOVE TO HEAR FROM YOU!!!!   Thank you Shelby Long for visiting [facility].  Providing our patients with the best experience possible is really important to [place], and we hope that you felt that on your recent visit. The most valuable feedback we get comes from [place]!!    If you receive a survey please take a couple of minutes to let us know how we did.Thank you for continuing to trust [place] with your care.   [facility]    Dysmenorrhea Dysmenorrhea means painful cramps during your period (menstrual period). You will have pain in your lower belly (abdomen). The pain is caused by the tightening (contracting) of the muscles of the womb (uterus). The pain may be mild or very bad. With this condition, you may:  Have a headache.  Feel sick to your stomach (nauseous).  Throw up (vomit).  Have lower back pain. Follow these instructions at home: Helping pain and cramping   Put heat on your lower back or belly when you have pain or cramps. Use the heat source that your doctor tells you to use. ? Place a towel between your skin and the heat. ? Leave the heat on for 20-30 minutes. ? Remove the heat if your skin turns bright red. This is especially important if you cannot feel pain, heat, or cold. ? Do not have a heating pad on during sleep.  Do aerobic exercises. These include walking, swimming, or biking. These may help with cramps.  Massage your lower back or belly. This may help lessen pain. General instructions  Take over-the-counter and prescription medicines only as told by your doctor.  Do not drive or use heavy machinery while taking prescription pain medicine.  Avoid alcohol and caffeine during and right before your period. These can make cramps worse.  Do not use any products that have nicotine or tobacco. These include cigarettes and e-cigarettes. If you need help quitting, ask your doctor.  Keep all follow-up visits as told by your doctor.  This is important. Contact a doctor if:  You have pain that gets worse.  You have pain that does not get better with medicine.  You have pain during sex.  You feel sick to your stomach or you throw up during your period, and medicine does not help. Get help right away if:  You pass out (faint). Summary  Dysmenorrhea means painful cramps during your period (menstrual period).  Put heat on your lower back or belly when you have pain or cramps.  Do exercises like walking, swimming, or biking to help with cramps.  Contact a doctor if you have pain during sex. This information is not intended to replace advice given to you by your health care provider. Make sure you discuss any questions you have with your health care provider. Document Released: 10/11/2008 Document Revised: 08/01/2016 Document Reviewed: 08/01/2016 Elsevier Interactive Patient Education  2019 Elsevier Inc. Ethinyl Estradiol; Norethindrone Acetate; Ferrous fumarate tablets or capsules What is this medicine? ETHINYL ESTRADIOL; NORETHINDRONE ACETATE; FERROUS FUMARATE (ETH in il es tra DYE ole; nor eth IN drone AS e tate; FER [place] FUE ma rate) is an oral contraceptive. The products combine two types of female hormones, an estrogen and a progestin. They are used to prevent ovulation and pregnancy. Some products are also used to treat acne in females. This medicine may be used for other purposes; ask your health care provider or pharmacist if you have questions. COMMON BRAND NAME(S): Aurovela 24  Fe 1/20, [employer], [place] [address] [place] Fe, Estrostep Fe, Gildess 24 Fe, Gildess Fe 1.5/30, Gildess Fe 1/20, Hailey 24 Fe, Junel Fe 1.5/30, Junel Fe 1/20, Junel Fe 24, Larin Fe, Lo Loestrin Fe, Loestrin 24 Fe, Loestrin FE 1.5/30, Loestrin FE 1/20, Lomedia 24 Fe, Microgestin 24 Fe, Microgestin Fe 1.5/30, Microgestin Fe 1/20, Tarina 24 Fe, Tarina Fe 1/20, Taytulla, Tilia Fe, Tri-Legest Fe What should I tell my health care provider before  I take this medicine? They need to know if you have any of these conditions: -abnormal vaginal bleeding -blood vessel disease -breast, cervical, endometrial, ovarian, liver, or uterine cancer -diabetes -gallbladder disease -heart disease or recent heart attack -high blood pressure -high cholesterol -history of blood clots -kidney disease -liver disease -migraine headaches -smoke tobacco -stroke -systemic lupus erythematosus (SLE) -an unusual or allergic reaction to estrogens, progestins, other medicines, foods, dyes, or preservatives -pregnant or trying to get pregnant -breast-feeding How should I use this medicine? Take this medicine by mouth. To reduce nausea, this medicine may be taken with food. Follow the directions on the prescription label. Take this medicine at the same time each day and in the order directed on the package. Do not take your medicine more often than directed. A patient package insert for the product will be given with each prescription and refill. Read this sheet carefully each time. The sheet may change frequently. Contact your pediatrician regarding the use of this medicine in children. Special care may be needed. This medicine has been used in female children who have started having menstrual periods. Overdosage: If you think you have taken too much of this medicine contact a poison control center or emergency room at once. NOTE: This medicine is only for you. Do not share this medicine with others. What if I miss a dose? If you miss a dose, refer to the patient information sheet you received with your medicine for direction. If you miss more than one pill, this medicine may not be as effective and you may need to use another form of birth control. What may interact with this medicine? Do not take this medicine with the following medication: -dasabuvir; ombitasvir; paritaprevir; ritonavir -ombitasvir; paritaprevir; ritonavir This medicine may also interact  with the following medications: -acetaminophen -antibiotics or medicines for infections, especially rifampin, rifabutin, rifapentine, and griseofulvin, and possibly penicillins or tetracyclines -aprepitant -ascorbic acid (vitamin C) -atorvastatin -barbiturate medicines, such as phenobarbital -bosentan -carbamazepine -caffeine -clofibrate -cyclosporine -dantrolene -doxercalciferol -felbamate -grapefruit juice -hydrocortisone -medicines for anxiety or sleeping problems, such as diazepam or temazepam -medicines for diabetes, including pioglitazone -mineral oil -modafinil -mycophenolate -nefazodone -oxcarbazepine -phenytoin -prednisolone -ritonavir or other medicines for HIV infection or AIDS -rosuvastatin -selegiline -soy isoflavones supplements -St. John's wort -tamoxifen or raloxifene -theophylline -thyroid hormones -topiramate -warfarin This list may not describe all possible interactions. Give your health care provider a list of all the medicines, herbs, non-prescription drugs, or dietary supplements you use. Also tell them if you smoke, drink alcohol, or use illegal drugs. Some items may interact with your medicine. What should I watch for while using this medicine? Visit your doctor or health care professional for regular checks on your progress. You will need a regular breast and pelvic exam and Pap smear while on this medicine. Use an additional method of contraception during the first cycle that you take these tablets. If you have any reason to think you are pregnant, stop taking this medicine right away and contact your doctor or health care professional. If you  are taking this medicine for hormone related problems, it may take several cycles of use to see improvement in your condition. Smoking increases the risk of getting a blood clot or having a stroke while you are taking birth control pills, especially if you are more than 24 years old. You are strongly advised  not to smoke. This medicine can make your body retain fluid, making your fingers, hands, or ankles swell. Your blood pressure can go up. Contact your doctor or health care professional if you feel you are retaining fluid. This medicine can make you more sensitive to the sun. Keep out of the sun. If you cannot avoid being in the sun, wear protective clothing and use sunscreen. Do not use sun lamps or tanning beds/booths. If you wear contact lenses and notice visual changes, or if the lenses begin to feel uncomfortable, consult your eye care specialist. In some women, tenderness, swelling, or minor bleeding of the gums may occur. Notify your dentist if this happens. Brushing and flossing your teeth regularly may help limit this. See your dentist regularly and inform your dentist of the medicines you are taking. If you are going to have elective surgery, you may need to stop taking this medicine before the surgery. Consult your health care professional for advice. This medicine does not protect you against HIV infection (AIDS) or any other sexually transmitted diseases. What side effects may I notice from receiving this medicine? Side effects that you should report to your doctor or health care professional as soon as possible: -allergic reactions like skin rash, itching or hives, swelling of the face, lips, or tongue -breast tissue changes or discharge -changes in vaginal bleeding during your period or between your periods -changes in vision -chest pain -confusion -coughing up blood -dizziness -feeling faint or lightheaded -headaches or migraines -leg, arm or groin pain -loss of balance or coordination -severe or sudden headaches -stomach pain (severe) -sudden shortness of breath -sudden numbness or weakness of the face, arm or leg -symptoms of vaginal infection like itching, irritation or unusual discharge -tenderness in the upper abdomen -trouble speaking or  understanding -vomiting -yellowing of the eyes or skin Side effects that usually do not require medical attention (report to your doctor or health care professional if they continue or are bothersome): -breakthrough bleeding and spotting that continues beyond the 3 initial cycles of pills -breast tenderness -mood changes, anxiety, depression, frustration, anger, or emotional outbursts -increased sensitivity to sun or ultraviolet light -nausea -skin rash, acne, or brown spots on the skin -weight gain (slight) This list may not describe all possible side effects. Call your doctor for medical advice about side effects. You may report side effects to FDA at 1-800-FDA-1088. Where should I keep my medicine? Keep out of the reach of children. Store at room temperature between 15 and 30 degrees C (59 and 86 degrees F). Throw away any unused medicine after the expiration date. NOTE: This sheet is a summary. It may not cover all possible information. If you have questions about this medicine, talk to your doctor, pharmacist, or health care provider.  2019 Elsevier/Gold Standard (2016-03-25 08:04:41) Cyclobenzaprine tablets What is this medicine? CYCLOBENZAPRINE (sye kloe BEN za preen) is a muscle relaxer. It is used to treat muscle pain, spasms, and stiffness. This medicine may be used for other purposes; ask your health care provider or pharmacist if you have questions. COMMON BRAND NAME(S): Fexmid, Flexeril What should I tell my health care provider before I take this medicine? They  need to know if you have any of these conditions: -heart disease, irregular heartbeat, or previous heart attack -liver disease -thyroid problem -an unusual or allergic reaction to cyclobenzaprine, tricyclic antidepressants, lactose, other medicines, foods, dyes, or preservatives -pregnant or trying to get pregnant -breast-feeding How should I use this medicine? Take this medicine by mouth with a glass of water.  Follow the directions on the prescription label. If this medicine upsets your stomach, take it with food or milk. Take your medicine at regular intervals. Do not take it more often than directed. Talk to your pediatrician regarding the use of this medicine in children. Special care may be needed. Overdosage: If you think you have taken too much of this medicine contact a poison control center or emergency room at once. NOTE: This medicine is only for you. Do not share this medicine with others. What if I miss a dose? If you miss a dose, take it as soon as you can. If it is almost time for your next dose, take only that dose. Do not take double or extra doses. What may interact with this medicine? Do not take this medicine with any of the following medications: -MAOIs like Carbex, Eldepryl, Marplan, Nardil, and Parnate This medicine may also interact with the following medications: -alcohol -antihistamines for allergy, cough, and cold -certain medicines for anxiety or sleep -certain medicines for depression like amitriptyline, fluoxetine, sertraline -certain medicines for seizures like phenobarbital, primidone -contrast dyes -local anesthetics like lidocaine, pramoxine, tetracaine -medicines that relax muscles for surgery -narcotic medicines for pain -phenothiazines like chlorpromazine, mesoridazine, prochlorperazine This list may not describe all possible interactions. Give your health care provider a list of all the medicines, herbs, non-prescription drugs, or dietary supplements you use. Also tell them if you smoke, drink alcohol, or use illegal drugs. Some items may interact with your medicine. What should I watch for while using this medicine? Tell your doctor or health care professional if your symptoms do not start to get better or if they get worse. You may get drowsy or dizzy. Do not drive, use machinery, or do anything that needs mental alertness until you know how this medicine  affects you. Do not stand or sit up quickly, especially if you are an older patient. This reduces the risk of dizzy or fainting spells. Alcohol may interfere with the effect of this medicine. Avoid alcoholic drinks. If you are taking another medicine that also causes drowsiness, you may have more side effects. Give your health care provider a list of all medicines you use. Your doctor will tell you how much medicine to take. Do not take more medicine than directed. Call emergency for help if you have problems breathing or unusual sleepiness. Your mouth may get dry. Chewing sugarless gum or sucking hard candy, and drinking plenty of water may help. Contact your doctor if the problem does not go away or is severe. What side effects may I notice from receiving this medicine? Side effects that you should report to your doctor or health care professional as soon as possible: -allergic reactions like skin rash, itching or hives, swelling of the face, lips, or tongue -breathing problems -chest pain -fast, irregular heartbeat -hallucinations -seizures -unusually weak or tired Side effects that usually do not require medical attention (report to your doctor or health care professional if they continue or are bothersome): -headache -nausea, vomiting This list may not describe all possible side effects. Call your doctor for medical advice about side effects. You may report  side effects to FDA at 1-800-FDA-1088. Where should I keep my medicine? Keep out of the reach of children. Store at room temperature between 15 and 30 degrees C (59 and 86 degrees F). Keep container tightly closed. Throw away any unused medicine after the expiration date. NOTE: This sheet is a summary. It may not cover all possible information. If you have questions about this medicine, talk to your doctor, pharmacist, or health care provider.  2019 Elsevier/Gold Standard (2017-05-07 13:04:35)  Preventive Care 18-39 Years,  Female Preventive care refers to lifestyle choices and visits with your health care provider that can promote health and wellness. What does preventive care include?   A yearly physical exam. This is also called an annual well check.  Dental exams once or twice a year.  Routine eye exams. Ask your health care provider how often you should have your eyes checked.  Personal lifestyle choices, including: ? Daily care of your teeth and gums. ? Regular physical activity. ? Eating a healthy diet. ? Avoiding tobacco and drug use. ? Limiting alcohol use. ? Practicing safe sex. ? Taking vitamin and mineral supplements as recommended by your health care provider. What happens during an annual well check? The services and screenings done by your health care provider during your annual well check will depend on your age, overall health, lifestyle risk factors, and family history of disease. Counseling Your health care provider may ask you questions about your:  Alcohol use.  Tobacco use.  Drug use.  Emotional well-being.  Home and relationship well-being.  Sexual activity.  Eating habits.  Work and work Statistician.  Method of birth control.  Menstrual cycle.  Pregnancy history. Screening You may have the following tests or measurements:  Height, weight, and BMI.  Diabetes screening. This is done by checking your blood sugar (glucose) after you have not eaten for a while (fasting).  Blood pressure.  Lipid and cholesterol levels. These may be checked every 5 years starting at age 27.  Skin check.  Hepatitis C blood test.  Hepatitis B blood test.  Sexually transmitted disease (STD) testing.  BRCA-related cancer screening. This may be done if you have a family history of breast, ovarian, tubal, or peritoneal cancers.  Pelvic exam and Pap test. This may be done every 3 years starting at age 24. Starting at age 70, this may be done every 5 years if you have a Pap test  in combination with an HPV test. Discuss your test results, treatment options, and if necessary, the need for more tests with your health care provider. Vaccines Your health care provider may recommend certain vaccines, such as:  Influenza vaccine. This is recommended every year.  Tetanus, diphtheria, and acellular pertussis (Tdap, Td) vaccine. You may need a Td booster every 10 years.  Varicella vaccine. You may need this if you have not been vaccinated.  HPV vaccine. If you are 7 or younger, you may need three doses over 6 months.  Measles, mumps, and rubella (MMR) vaccine. You may need at least one dose of MMR. You may also need a second dose.  Pneumococcal 13-valent conjugate (PCV13) vaccine. You may need this if you have certain conditions and were not previously vaccinated.  Pneumococcal polysaccharide (PPSV23) vaccine. You may need one or two doses if you smoke cigarettes or if you have certain conditions.  Meningococcal vaccine. One dose is recommended if you are age 47-21 years and a first-year college student living in a residence hall, or if you  have one of several medical conditions. You may also need additional booster doses.  Hepatitis A vaccine. You may need this if you have certain conditions or if you travel or work in places where you may be exposed to hepatitis A.  Hepatitis B vaccine. You may need this if you have certain conditions or if you travel or work in places where you may be exposed to hepatitis B.  Haemophilus influenzae type b (Hib) vaccine. You may need this if you have certain risk factors. Talk to your health care provider about which screenings and vaccines you need and how often you need them. This information is not intended to replace advice given to you by your health care provider. Make sure you discuss any questions you have with your health care provider. Document Released: 09/10/2001 Document Revised: 02/25/2017 Document Reviewed:  05/16/2015 Elsevier Interactive Patient Education  2019 [employer].

## 2018-09-23 ENCOUNTER — Encounter: Payer: Self-pay | Admitting: Emergency Medicine

## 2018-09-23 ENCOUNTER — Emergency Department
Admission: EM | Admit: 2018-09-23 | Discharge: 2018-09-23 | Disposition: A | Discharge status: Home / Self Care | Payer: 59 | Attending: Student in an Organized Health Care Education/Training Program | Admitting: Student in an Organized Health Care Education/Training Program

## 2018-09-23 ENCOUNTER — Other Ambulatory Visit: Payer: Self-pay

## 2018-09-23 DIAGNOSIS — T782XXA Anaphylactic shock, unspecified, initial encounter: Secondary | ICD-10-CM | POA: Diagnosis not present

## 2018-09-23 DIAGNOSIS — R600 Localized edema: Secondary | ICD-10-CM | POA: Diagnosis present

## 2018-09-23 DIAGNOSIS — R070 Pain in throat: Secondary | ICD-10-CM | POA: Insufficient documentation

## 2018-09-23 MED ORDER — EPINEPHRINE 0.3 MG/0.3ML IJ SOAJ
0.3000 mg | Freq: Once | INTRAMUSCULAR | Status: AC
  Administered 2018-09-23: 0.3 mg via INTRAMUSCULAR
  Filled 2018-09-23: qty 0.3

## 2018-09-23 MED ORDER — FAMOTIDINE IN NACL 20-0.9 MG/50ML-% IV SOLN
20.0000 mg | Freq: Once | INTRAVENOUS | Status: AC
  Administered 2018-09-23: 20 mg via INTRAVENOUS
  Filled 2018-09-23: qty 50

## 2018-09-23 MED ORDER — METHYLPREDNISOLONE SODIUM SUCC 125 MG IJ SOLR
125.0000 mg | Freq: Once | INTRAMUSCULAR | Status: AC
  Administered 2018-09-23: 125 mg via INTRAVENOUS
  Filled 2018-09-23: qty 2

## 2018-09-23 MED ORDER — EPINEPHRINE 0.3 MG/0.3ML IJ SOAJ: 0.3000 mg | INTRAMUSCULAR | 1 refills | Status: AC | PRN

## 2018-09-23 MED ORDER — PREDNISONE 20 MG PO TABS: 40.0000 mg | ORAL_TABLET | Freq: Every day | ORAL | 0 refills | Status: AC

## 2018-09-23 NOTE — ED Provider Notes (Signed)
[facility] Emergency Department Provider Note    First MD Initiated Contact with Patient 09/23/18 1020     (approximate)  I have reviewed the triage vital signs and the nursing notes.   HISTORY  Chief Complaint Allergic Reaction    HPI Shelby Long is a 23 y.o. female presents the ER for evaluation of swelling of her lips.  This occurred roughly 15 minutes after taking the new antibiotic that she was prescribed for an ear infection.  States occurred around 9 AM.  She gave herself an EpiPen as well as a Benadryl with improvement in symptoms but the swelling is returning so she came to the ER.  Does feel that she is have some scratchiness in her throat and feels over the past 15 to 20 minutes that the swelling in her lips has returned.  Denies any family history of angioedema.  She is not on any antihypertensive medications.  Has never seen an allergist before.    Past Medical History:  Diagnosis Date  . PONV (postoperative nausea and vomiting)   . Preeclampsia    Resolved after birth of her daughter    Family History  Problem Relation Age of Onset  . Sjogren's syndrome Mother   . Breast cancer Maternal Grandmother   . Parkinson's disease Maternal Grandfather   . Ovarian cancer Neg Hx   . Colon cancer Neg Hx    Past Surgical History:  Procedure Laterality Date  . CESAREAN SECTION    . CYST EXCISION N/A    on tailbone  . ESOPHAGOGASTRODUODENOSCOPY (EGD) WITH PROPOFOL N/A 03/17/2017   Procedure: ESOPHAGOGASTRODUODENOSCOPY (EGD) WITH PROPOFOL;  Surgeon: Christena Deem, MD;  Location: [facility] ENDOSCOPY;  Service: Endoscopy;  Laterality: N/A;  . WISDOM TOOTH EXTRACTION  2015   Patient Active Problem List   Diagnosis Date Noted  . Anxiety 06/02/2016  . History of depression 06/02/2016      Prior to Admission medications   Medication Sig Start Date End Date Taking? Authorizing Provider  levofloxacin (LEVAQUIN) 500 MG tablet Take 500 mg by  mouth daily. 09/22/18 10/02/18 Yes [provider]  oseltamivir (TAMIFLU) 75 MG capsule Take 75 mg by mouth 2 (two) times daily. 09/22/18 09/27/18 Yes [provider]  cyclobenzaprine (FLEXERIL) 10 MG tablet Take 1 tablet (10 mg total) by mouth 3 (three) times daily as needed for muscle spasms. 08/18/18   Lawhorn, Vanessa Melody Hill, CNM  EPINEPHrine (EPIPEN 2-PAK) 0.3 mg/0.3 mL IJ SOAJ injection Inject into the muscle.    [provider]  EPINEPHrine 0.3 mg/0.3 mL IJ SOAJ injection Inject 0.3 mLs (0.3 mg total) into the muscle as needed for anaphylaxis. 09/23/18   Willy Eddy, MD  ibuprofen (ADVIL,MOTRIN) 800 MG tablet Take 1 tablet (800 mg total) by mouth every 8 (eight) hours as needed. 05/18/18   Gunnar Bulla, CNM  norethindrone-ethinyl estradiol (LOESTRIN 1/20, 21,) 1-20 MG-MCG tablet Take 1 tablet by mouth daily. Patient not taking: Reported on 08/18/2018 05/18/18   Gunnar Bulla, CNM  predniSONE (DELTASONE) 20 MG tablet Take 2 tablets (40 mg total) by mouth daily for 5 days. 09/23/18 09/28/18  Willy Eddy, MD    Allergies Levaquin [levofloxacin]; Other; Shellfish-derived products; Sulfa antibiotics; Clindamycin/lincomycin; Vancomycin; and Penicillins    Social History Social History   Tobacco Use  . Smoking status: Never Smoker  . Smokeless tobacco: Never Used  Substance Use Topics  . Alcohol use: Yes    Comment: occasional  . Drug use: No  Review of Systems Patient denies headaches, rhinorrhea, blurry vision, numbness, shortness of breath, chest pain, edema, cough, abdominal pain, nausea, vomiting, diarrhea, dysuria, fevers, rashes or hallucinations unless otherwise stated above in HPI. ____________________________________________   PHYSICAL EXAM:  VITAL SIGNS: Vitals:   09/23/18 0955  BP: (!) 142/80  Pulse: (!) 115  Resp: 16  Temp: 99.2 F (37.3 C)  SpO2: 98%    Constitutional: Alert and oriented.  Eyes:  Conjunctivae are normal.  Head: Atraumatic. Nose: No congestion/rhinnorhea. Mouth/Throat: Mucous membranes are moist.  No uvular swelling.  There is some edema of the lower lip. Neck: No stridor. Painless ROM.  Cardiovascular: Normal rate, regular rhythm. Grossly normal heart sounds.  Good peripheral circulation. Respiratory: Normal respiratory effort.  No retractions. Lungs CTAB. Gastrointestinal: Soft and nontender. No distention. No abdominal bruits. No CVA tenderness. Genitourinary:  Musculoskeletal: No lower extremity tenderness nor edema.  No joint effusions. Neurologic:  Normal speech and language. No gross focal neurologic deficits are appreciated. No facial droop Skin:  Skin is warm, dry and intact. No rash noted. Psychiatric: Mood and affect are normal. Speech and behavior are normal.  ____________________________________________   LABS (all labs ordered are listed, but only abnormal results are displayed)  No results found for this or any previous visit (from the past 24 hour(s)). ____________________________________________ _________________________________   PROCEDURES  Procedure(s) performed:  Procedures    Critical Care performed: no ____________________________________________   INITIAL IMPRESSION / ASSESSMENT AND PLAN / ED COURSE  Pertinent labs & imaging results that were available during my care of the patient were reviewed by me and considered in my medical decision making (see chart for details).   DDX: Anaphylaxis, anaphylactoid, urticaria, angioedema  [name] is a 23 y.o. who presents to the ED with symptoms as described above.  Patient was some swelling and angioedema changes to her lower lip.  No uvular swelling.  No stridor.  No wheeze.  Symptoms seem to be recurring after initial dose of epinephrine therefore will re-dose epinephrine give Solu-Medrol and Pepcid and observe patient in the ER.  Clinical Course as of Sep 23 1352  Wed Sep 23, 2018  1114 Patient reassessed.  Lip swelling is significantly improved.  We will continue to observe for total of 4 hours she has had 2 episodes and 2 doses of epinephrine.   [PR]  1342 Patient reassessed.  Remains asymptomatic.  Evaluation of the ear does not appear to have any acute otitis at this time.  Probable viral illness.  Given her allergies and reactions antibiotics I do believe that a period of trial without antibiotics is appropriate.  Will be discharged home on steroids as well as given referral to allergist and refill on her epinephrine.   [PR]    Clinical Course User Index [PR] Willy Eddy, MD     As part of my medical decision making, I reviewed the following data within the electronic MEDICAL RECORD NUMBER Nursing notes reviewed and incorporated, Labs reviewed, notes from prior ED visits and [place] Controlled Substance Database   ____________________________________________   FINAL CLINICAL IMPRESSION(S) / ED DIAGNOSES  Final diagnoses:  Anaphylaxis, initial encounter      NEW MEDICATIONS STARTED DURING THIS VISIT:  New Prescriptions   EPINEPHRINE 0.3 MG/0.3 ML IJ SOAJ INJECTION    Inject 0.3 mLs (0.3 mg total) into the muscle as needed for anaphylaxis.   PREDNISONE (DELTASONE) 20 MG TABLET    Take 2 tablets (40 mg total) by mouth daily for 5  days.     Note:  This document was prepared using Dragon voice recognition software and may include unintentional dictation errors.    Willy Eddy, MD 09/23/18 [phone]

## 2018-09-23 NOTE — ED Notes (Signed)
Pt signed hard copy for discharge 

## 2018-09-23 NOTE — ED Notes (Signed)
Pt's father to desk, states patients lip swelling is worsening.  Pt roomed at this time.  Geraldine Contras, RN notified of new patient.

## 2018-09-23 NOTE — ED Notes (Signed)
Pt stating that she feels better now that she has received medication.

## 2018-09-23 NOTE — ED Notes (Signed)
Pt offered something to drink, states that she is ok and doesn't want anything right now.

## 2018-09-23 NOTE — ED Notes (Signed)
Charge RN notified of patient presentation; pt will receive next available bed.

## 2018-09-23 NOTE — ED Notes (Signed)
IV removed per protocol from right AC, bandage placed, no bleeding, catheter intact.

## 2018-09-23 NOTE — ED Triage Notes (Signed)
Pt in via POV, reports allergic reaction to Levaquin which she fist took this morning.  Pt reports hx of itching to mouth and back of throat, and lip swelling.  Pt has used Epi Pen and taken 2 benadryl prior to arrival.  Some lip swelling noted, pt states swelling has reduced significantly.  Pt able to speak in full, clear sentences, NAD noted at this time.

## 2018-11-17 ENCOUNTER — Encounter: Payer: 59 | Admitting: Certified Nurse Midwife

## 2018-12-22 ENCOUNTER — Other Ambulatory Visit: Payer: Self-pay | Admitting: Obstetrics and Gynecology

## 2018-12-25 ENCOUNTER — Encounter: Payer: 59 | Admitting: Certified Nurse Midwife

## 2019-02-01 ENCOUNTER — Other Ambulatory Visit: Payer: Self-pay | Admitting: Certified Nurse Midwife

## 2019-02-19 ENCOUNTER — Encounter: Payer: 59 | Admitting: Certified Nurse Midwife

## 2019-02-21 ENCOUNTER — Other Ambulatory Visit: Payer: Self-pay | Admitting: Certified Nurse Midwife

## 2019-02-22 ENCOUNTER — Telehealth: Payer: Self-pay | Admitting: Certified Nurse Midwife

## 2019-02-22 NOTE — Telephone Encounter (Signed)
Spoke with patient.  Patient request 1 month refill on OCP.  Prescription sent.  Patient aware she needs to have annual exam for more refills and verbalized understanding.

## 2019-02-22 NOTE — Telephone Encounter (Signed)
Pt called and requested a call back in regards to needing a prescription refill before her next appointment. Please advise.

## 2019-03-03 ENCOUNTER — Telehealth: Payer: Self-pay

## 2019-03-03 NOTE — Telephone Encounter (Signed)
Coronavirus (COVID-19) Are you at risk?  Are you at risk for the Coronavirus (COVID-19)?  To be considered HIGH RISK for Coronavirus (COVID-19), you have to meet the following criteria:  . Traveled to [place], [place], [place], [place] or [place]; or in the [place] to [place], [place], [place], or [place]; and have fever, cough, and shortness of breath within the last 2 weeks of travel OR . Been in close contact with a person diagnosed with COVID-19 within the last 2 weeks and have fever, cough, and shortness of breath . IF YOU DO NOT MEET THESE CRITERIA, YOU ARE CONSIDERED LOW RISK FOR COVID-19.  What to do if you are HIGH RISK for COVID-19?  . If you are having a medical emergency, call 911. . Seek medical care right away. Before you go to a doctor's office, urgent care or emergency department, call ahead and tell them about your recent travel, contact with someone diagnosed with COVID-19, and your symptoms. You should receive instructions from your physician's office regarding next steps of care.  . When you arrive at healthcare provider, tell the healthcare staff immediately you have returned from visiting [place], [place], [place], [place] or [place]; or traveled in the [place] to [place], [place], [place], or [place]; in the last two weeks or you have been in close contact with a person diagnosed with COVID-19 in the last 2 weeks.   . Tell the health care staff about your symptoms: fever, cough and shortness of breath. . After you have been seen by a medical provider, you will be either: o Tested for (COVID-19) and discharged home on quarantine except to seek medical care if symptoms worsen, and asked to  - Stay home and avoid contact with others until you get your results (4-5 days)  - Avoid travel on public transportation if possible (such as bus, train, or airplane) or o Sent to the Emergency Department by EMS for evaluation, COVID-19 testing, and possible  admission depending on your condition and test results.  What to do if you are LOW RISK for COVID-19?  Reduce your risk of any infection by using the same precautions used for avoiding the common cold or flu:  . Wash your hands often with soap and warm water for at least 20 seconds.  If soap and water are not readily available, use an alcohol-based hand sanitizer with at least 60% alcohol.  . If coughing or sneezing, cover your mouth and nose by coughing or sneezing into the elbow areas of your shirt or coat, into a tissue or into your sleeve (not your hands). . Avoid shaking hands with others and consider head nods or verbal greetings only. . Avoid touching your eyes, nose, or mouth with unwashed hands.  . Avoid close contact with people who are Shelby Long. . Avoid places or events with large numbers of people in one location, like concerts or sporting events. . Carefully consider travel plans you have or are making. . If you are planning any travel outside or inside the [place], visit the CDC's Travelers' Health webpage for the latest health notices. . If you have some symptoms but not all symptoms, continue to monitor at home and seek medical attention if your symptoms worsen. . If you are having a medical emergency, call 911.  03/03/19 SCREENING NEG SLS ADDITIONAL HEALTHCARE OPTIONS FOR PATIENTS  [facility] / e-Visit: [email or web address]         [facility]: [phone]    [facility]: [phone]                   [facility]: [phone]  

## 2019-03-04 ENCOUNTER — Other Ambulatory Visit: Payer: Self-pay

## 2019-03-04 ENCOUNTER — Ambulatory Visit (INDEPENDENT_AMBULATORY_CARE_PROVIDER_SITE_OTHER): Payer: 59 | Admitting: Certified Nurse Midwife

## 2019-03-04 ENCOUNTER — Other Ambulatory Visit (HOSPITAL_COMMUNITY)
Admission: RE | Admit: 2019-03-04 | Discharge: 2019-03-04 | Disposition: A | Discharge status: Home / Self Care | Payer: 59 | Source: Ambulatory Visit | Attending: Certified Nurse Midwife | Admitting: Certified Nurse Midwife

## 2019-03-04 ENCOUNTER — Encounter: Payer: Self-pay | Admitting: Certified Nurse Midwife

## 2019-03-04 VITALS — BP 116/80 | HR 79 | Ht 69.0 in | Wt 227.9 lb

## 2019-03-04 DIAGNOSIS — Z01419 Encounter for gynecological examination (general) (routine) without abnormal findings: Secondary | ICD-10-CM | POA: Diagnosis present

## 2019-03-04 DIAGNOSIS — Z124 Encounter for screening for malignant neoplasm of cervix: Secondary | ICD-10-CM | POA: Diagnosis present

## 2019-03-04 DIAGNOSIS — M545 Low back pain, unspecified: Secondary | ICD-10-CM | POA: Insufficient documentation

## 2019-03-04 DIAGNOSIS — M25551 Pain in right hip: Secondary | ICD-10-CM | POA: Insufficient documentation

## 2019-03-04 DIAGNOSIS — N941 Unspecified dyspareunia: Secondary | ICD-10-CM | POA: Diagnosis not present

## 2019-03-04 DIAGNOSIS — M25552 Pain in left hip: Secondary | ICD-10-CM

## 2019-03-04 DIAGNOSIS — Z3041 Encounter for surveillance of contraceptive pills: Secondary | ICD-10-CM | POA: Diagnosis not present

## 2019-03-04 MED ORDER — NORETHINDRONE ACET-ETHINYL EST 1-20 MG-MCG PO TABS: 1.0000 | ORAL_TABLET | Freq: Every day | ORAL | 4 refills | Status: DC

## 2019-03-04 NOTE — Progress Notes (Signed)
Patient here for annual exam, c/o intermittent back pain x9 months.

## 2019-03-04 NOTE — Patient Instructions (Addendum)
Chronic Back Pain When back pain lasts longer than 3 months, it is called chronic back pain. Pain may get worse at certain times (flare-ups). There are things you can do at home to manage your pain. Follow these instructions at home: Activity      Avoid bending and other activities that make pain worse.  When standing: ? Keep your upper back and neck straight. ? Keep your shoulders pulled back. ? Avoid slouching.  When sitting: ? Keep your back straight. ? Relax your shoulders. Do not round your shoulders or pull them backward.  Do not sit or stand in one place for long periods of time.  Take short rest breaks during the day. Lying down or standing is usually better than sitting. Resting can help relieve pain.  When sitting or lying down for a long time, do some mild activity or stretching. This will help to prevent stiffness and pain.  Get regular exercise. Ask your doctor what activities are safe for you.  Do not lift anything that is heavier than 10 lb (4.5 kg). To prevent injury when you lift things: ? Bend your knees. ? Keep the weight close to your body. ? Avoid twisting. Managing pain  If told, put ice on the painful area. Your doctor may tell you to use ice for 24-48 hours after a flare-up starts. ? Put ice in a plastic bag. ? Place a towel between your skin and the bag. ? Leave the ice on for 20 minutes, 2-3 times a day.  If told, put heat on the painful area as often as told by your doctor. Use the heat source that your doctor recommends, such as a moist heat pack or a heating pad. ? Place a towel between your skin and the heat source. ? Leave the heat on for 20-30 minutes. ? Remove the heat if your skin turns bright red. This is especially important if you are unable to feel pain, heat, or cold. You may have a greater risk of getting burned.  Soak in a warm bath. This can help relieve pain.  Take over-the-counter and prescription medicines only as told by your  doctor. General instructions  Sleep on a firm mattress. Try lying on your side with your knees slightly bent. If you lie on your back, put a pillow under your knees.  Keep all follow-up visits as told by your doctor. This is important. Contact a doctor if:  You have pain that does not get better with rest or medicine. Get help right away if:  One or both of your arms or legs feel weak.  One or both of your arms or legs lose feeling (numbness).  You have trouble controlling when you poop (bowel movement) or pee (urinate).  You feel sick to your stomach (nauseous).  You throw up (vomit).  You have belly (abdominal) pain.  You have shortness of breath.  You pass out (faint). Summary  When back pain lasts longer than 3 months, it is called chronic back pain.  Pain may get worse at certain times (flare-ups).  Use ice and heat as told by your doctor. Your doctor may tell you to use ice after flare-ups. This information is not intended to replace advice given to you by your health care provider. Make sure you discuss any questions you have with your health care provider. Document Released: 01/01/2008 Document Revised: 11/05/2018 Document Reviewed: 02/27/2017 Elsevier Patient Education  [place]. Ethinyl Estradiol; Norethindrone Acetate; Ferrous fumarate tablets or  capsules What is this medicine? ETHINYL ESTRADIOL; NORETHINDRONE ACETATE; FERROUS FUMARATE (ETH in il es tra DYE ole; nor eth IN drone AS e tate; FER [place] FUE ma rate) is an oral contraceptive. The products combine two types of female hormones, an estrogen and a progestin. They are used to prevent ovulation and pregnancy. Some products are also used to treat acne in females. This medicine may be used for other purposes; ask your health care provider or pharmacist if you have questions. COMMON BRAND NAME(S): Aurovela [address] 1/20, Aurovela Fe, [place] [address] Fe, [place]ro[place] Fe, Gildess 24 Fe, Gildess Fe 1.5/30,  Gildess Fe 1/20, Hailey 24 Fe, Junel Fe 1.5/30, Junel Fe 1/20, Junel Fe 24, Larin Fe, Lo Loestrin Fe, Loestrin 24 Fe, Loestrin FE 1.5/30, Loestrin FE 1/20, Lomedia 24 Fe, Microgestin 24 Fe, Microgestin Fe 1.5/30, Microgestin Fe 1/20, Tarina 24 Fe, Tarina Fe 1/20, [place], Tilia Fe, Tri-Legest Fe What should I tell my health care provider before I take this medicine? They need to know if you have any of these conditions:  abnormal vaginal bleeding  blood vessel disease  breast, cervical, endometrial, ovarian, liver, or uterine cancer  diabetes  gallbladder disease  heart disease or recent heart attack  high blood pressure  high cholesterol  history of blood clots  kidney disease  liver disease  migraine headaches  smoke tobacco  stroke  systemic lupus erythematosus (SLE)  an unusual or allergic reaction to estrogens, progestins, other medicines, foods, dyes, or preservatives  pregnant or trying to get pregnant  breast-feeding How should I use this medicine? Take this medicine by mouth. To reduce nausea, this medicine may be taken with food. Follow the directions on the prescription label. Take this medicine at the same time each day and in the order directed on the package. Do not take your medicine more often than directed. A patient package insert for the product will be given with each prescription and refill. Read this sheet carefully each time. The sheet may change frequently. Contact your pediatrician regarding the use of this medicine in children. Special care may be needed. This medicine has been used in female children who have started having menstrual periods. Overdosage: If you think you have taken too much of this medicine contact a poison control center or emergency room at once. NOTE: This medicine is only for you. Do not share this medicine with others. What if I miss a dose? If you miss a dose, refer to the patient information sheet you received with your  medicine for direction. If you miss more than one pill, this medicine may not be as effective and you may need to use another form of birth control. What may interact with this medicine? Do not take this medicine with the following medication:  dasabuvir; ombitasvir; paritaprevir; ritonavir  ombitasvir; paritaprevir; ritonavir This medicine may also interact with the following medications:  acetaminophen  antibiotics or medicines for infections, especially rifampin, rifabutin, rifapentine, and griseofulvin, and possibly penicillins or tetracyclines  aprepitant  ascorbic acid (vitamin C)  atorvastatin  barbiturate medicines, such as phenobarbital  bosentan  carbamazepine  caffeine  clofibrate  cyclosporine  dantrolene  doxercalciferol  felbamate  grapefruit juice  hydrocortisone  medicines for anxiety or sleeping problems, such as diazepam or temazepam  medicines for diabetes, including pioglitazone  mineral oil  modafinil  mycophenolate  nefazodone  oxcarbazepine  phenytoin  prednisolone  ritonavir or other medicines for HIV infection or AIDS  rosuvastatin  selegiline  soy  isoflavones supplements  St. John's wort  tamoxifen or raloxifene  theophylline  thyroid hormones  topiramate  warfarin This list may not describe all possible interactions. Give your health care provider a list of all the medicines, herbs, non-prescription drugs, or dietary supplements you use. Also tell them if you smoke, drink alcohol, or use illegal drugs. Some items may interact with your medicine. What should I watch for while using this medicine? Visit your doctor or health care professional for regular checks on your progress. You will need a regular breast and pelvic exam and Pap smear while on this medicine. Use an additional method of contraception during the first cycle that you take these tablets. If you have any reason to think you are pregnant, stop  taking this medicine right away and contact your doctor or health care professional. If you are taking this medicine for hormone related problems, it may take several cycles of use to see improvement in your condition. Smoking increases the risk of getting a blood clot or having a stroke while you are taking birth control pills, especially if you are more than 23 years old. You are strongly advised not to smoke. This medicine can make your body retain fluid, making your fingers, hands, or ankles swell. Your blood pressure can go up. Contact your doctor or health care professional if you feel you are retaining fluid. This medicine can make you more sensitive to the sun. Keep out of the sun. If you cannot avoid being in the sun, wear protective clothing and use sunscreen. Do not use sun lamps or tanning beds/booths. If you wear contact lenses and notice visual changes, or if the lenses begin to feel uncomfortable, consult your eye care specialist. In some women, tenderness, swelling, or minor bleeding of the gums may occur. Notify your dentist if this happens. Brushing and flossing your teeth regularly may help limit this. See your dentist regularly and inform your dentist of the medicines you are taking. If you are going to have elective surgery, you may need to stop taking this medicine before the surgery. Consult your health care professional for advice. This medicine does not protect you against HIV infection (AIDS) or any other sexually transmitted diseases. What side effects may I notice from receiving this medicine? Side effects that you should report to your doctor or health care professional as soon as possible:  allergic reactions like skin rash, itching or hives, swelling of the face, lips, or tongue  breast tissue changes or discharge  changes in vaginal bleeding during your period or between your periods  changes in vision  chest pain  confusion  coughing up blood  dizziness   feeling faint or lightheaded  headaches or migraines  leg, arm or groin pain  loss of balance or coordination  severe or sudden headaches  stomach pain (severe)  sudden shortness of breath  sudden numbness or weakness of the face, arm or leg  symptoms of vaginal infection like itching, irritation or unusual discharge  tenderness in the upper abdomen  trouble speaking or understanding  vomiting  yellowing of the eyes or skin Side effects that usually do not require medical attention (report to your doctor or health care professional if they continue or are bothersome):  breakthrough bleeding and spotting that continues beyond the 3 initial cycles of pills  breast tenderness  mood changes, anxiety, depression, frustration, anger, or emotional outbursts  increased sensitivity to sun or ultraviolet light  nausea  skin rash, acne, or  brown spots on the skin  weight gain (slight) This list may not describe all possible side effects. Call your doctor for medical advice about side effects. You may report side effects to FDA at 1-800-FDA-1088. Where should I keep my medicine? Keep out of the reach of children. Store at room temperature between 15 and 30 degrees C (59 and 86 degrees F). Throw away any unused medicine after the expiration date. NOTE: This sheet is a summary. It may not cover all possible information. If you have questions about this medicine, talk to your doctor, pharmacist, or health care provider.  2020 Elsevier/Gold Standard (2016-03-25 08:04:41) Preventive Care 10-75 Years Old, Female Preventive care refers to visits with your health care provider and lifestyle choices that can promote health and wellness. This includes:  A yearly physical exam. This may also be called an annual well check.  Regular dental visits and eye exams.  Immunizations.  Screening for certain conditions.  Healthy lifestyle choices, such as eating a healthy diet, getting  regular exercise, not using drugs or products that contain nicotine and tobacco, and limiting alcohol use. What can I expect for my preventive care visit? Physical exam Your health care provider will check your:  Height and weight. This may be used to calculate body mass index (BMI), which tells if you are at a healthy weight.  Heart rate and blood pressure.  Skin for abnormal spots. Counseling Your health care provider may ask you questions about your:  Alcohol, tobacco, and drug use.  Emotional well-being.  Home and relationship well-being.  Sexual activity.  Eating habits.  Work and work Statistician.  Method of birth control.  Menstrual cycle.  Pregnancy history. What immunizations do I need?  Influenza (flu) vaccine  This is recommended every year. Tetanus, diphtheria, and pertussis (Tdap) vaccine  You may need a Td booster every 10 years. Varicella (chickenpox) vaccine  You may need this if you have not been vaccinated. Human papillomavirus (HPV) vaccine  If recommended by your health care provider, you may need three doses over 6 months. Measles, mumps, and rubella (MMR) vaccine  You may need at least one dose of MMR. You may also need a second dose. Meningococcal conjugate (MenACWY) vaccine  One dose is recommended if you are age 28-21 years and a first-year college student living in a residence hall, or if you have one of several medical conditions. You may also need additional booster doses. Pneumococcal conjugate (PCV13) vaccine  You may need this if you have certain conditions and were not previously vaccinated. Pneumococcal polysaccharide (PPSV23) vaccine  You may need one or two doses if you smoke cigarettes or if you have certain conditions. Hepatitis A vaccine  You may need this if you have certain conditions or if you travel or work in places where you may be exposed to hepatitis A. Hepatitis B vaccine  You may need this if you have certain  conditions or if you travel or work in places where you may be exposed to hepatitis B. Haemophilus influenzae type b (Hib) vaccine  You may need this if you have certain conditions. You may receive vaccines as individual doses or as more than one vaccine together in one shot (combination vaccines). Talk with your health care provider about the risks and benefits of combination vaccines. What tests do I need?  Blood tests  Lipid and cholesterol levels. These may be checked every 5 years starting at age 57.  Hepatitis C test.  Hepatitis B test.  Screening  Diabetes screening. This is done by checking your blood sugar (glucose) after you have not eaten for a while (fasting).  Sexually transmitted disease (STD) testing.  BRCA-related cancer screening. This may be done if you have a family history of breast, ovarian, tubal, or peritoneal cancers.  Pelvic exam and Pap test. This may be done every 3 years starting at age 50. Starting at age 34, this may be done every 5 years if you have a Pap test in combination with an HPV test. Talk with your health care provider about your test results, treatment options, and if necessary, the need for more tests. Follow these instructions at home: Eating and drinking   Eat a diet that includes fresh fruits and vegetables, whole grains, lean protein, and low-fat dairy.  Take vitamin and mineral supplements as recommended by your health care provider.  Do not drink alcohol if: ? Your health care provider tells you not to drink. ? You are pregnant, may be pregnant, or are planning to become pregnant.  If you drink alcohol: ? Limit how much you have to 0-1 drink a day. ? Be aware of how much alcohol is in your drink. In the [place] one drink equals one 12 oz bottle of beer (355 mL), one 5 oz glass of wine (148 mL), or one 1 oz glass of hard liquor (44 mL). Lifestyle  Take daily care of your teeth and gums.  Stay active. Exercise for at least 30  minutes on 5 or more days each week.  Do not use any products that contain nicotine or tobacco, such as cigarettes, e-cigarettes, and chewing tobacco. If you need help quitting, ask your health care provider. If you are sexually active, practice safe sex. Use a condom or other form of birth control (contraception) in order to prevent pregnancy and STIs (sexually transmitted infections). If you plan to become pregnant, see your health care provider for a preconception visit. Dyspareunia, Female Dyspareunia is pain that is associated with sexual activity. This can affect any part of the genitals or lower abdomen. There are many possible causes of this condition. In some cases, diagnosing the cause of dyspareunia can be difficult. This condition can be mild, moderate, or severe. Depending on the cause, dyspareunia may get better with treatment, but may return (recur) over time. What are the causes?  The cause of this condition is not always known. However, problems that affect the vulva, vagina, uterus, and other organs may cause dyspareunia. Common causes of this condition include: Vaginal dryness. Giving birth. Infection. Skin changes or conditions. Side effects of medicines. Endometriosis. This is when tissue that is like the lining of the uterus grows on the outside of the uterus. Psychological conditions. These include depression, anxiety, or traumatic experiences. Allergic reaction. What increases the risk? The following factors may make you more likely to develop this condition: History of physical or sexual trauma. Some medicines. No longer having a monthly period (menopause). Having recently given birth. Taking baths using soaps that have perfumes. These can cause irritation. Douching. What are the signs or symptoms? The main symptom of this condition is pain in any part of your genitals or lower abdomen during or after sex. This may include: Irritation, burning, or stinging  sensations in your vulva. Discomfort when your vulva or surrounding area is touched. Aching and throbbing pain that may be constant. Pain that gets worse when something is inserted into your vagina. How is this diagnosed? This condition may be diagnosed  based on: Your symptoms, including where and when your pain occurs. Your medical history. A physical exam. A pelvic exam will most likely be done. Tests that include ultrasound, blood tests, and tests that check the body for infection. Imaging tests, such as X-ray, MRI, and CT scan. You may be referred to a health care provider who specializes in women's health (gynecologist). How is this treated? Treatment depends on the cause of your condition and your symptoms. In most cases, you may need to stop sexual activity until your symptoms go away or get better. Treatment may include: Lubricants, ointments, and creams. Physical therapy. Massage therapy. Hormonal therapy. Medicines to: Prevent or fight infection. Relieve pain. Help numb the area. Treat depression (antidepressants). Counseling, which may include sex therapy. Surgery. Follow these instructions at home: Lifestyle Wear cotton underwear. Use water-based lubricants as needed during sex. Avoid oil-based lubricants. Do not use any products that can cause irritation. This may include certain condoms, spermicides, lubricants, soaps, tampons, vaginal sprays, or douches. Always practice safe sex. Use a condom to prevent sexually transmitted infections (STIs). Talk freely with your partner about your condition. General instructions Take or apply over-the-counter and prescription medicines only as told by your health care provider. Urinate before you have sex. Consider joining a support group. Get the results of any tests you have done. Ask your health care provider, or the department that is doing the procedure, when your results will be ready. Keep all follow-up visits as told by  your health care provider. This is important. Contact a health care provider if: You have vaginal bleeding after having sex. You develop a lump at the opening of your vagina even if the lump is painless. You have: Abnormal discharge from your vagina. Vaginal dryness. Itchiness or irritation of your vulva or vagina. A new rash. Symptoms that get worse or do not improve with treatment. A fever. Pain when you urinate. Blood in your urine. Get help right away if: You have severe pain in your abdomen during or shortly after sex. You pass out after sex. Summary Dyspareunia is pain that is associated with sexual activity. This can affect any part of the genitals or lower abdomen. There are many causes of this condition. Treatment depends on the cause and your symptoms. In most cases, you may need to stop sexual activity until your symptoms improve. Take or apply over-the-counter and prescription medicines only as told by your health care provider. Contact a health care provider if your symptoms get worse or do not improve with treatment. Keep all follow-up visits as told by your health care provider. This is important. This information is not intended to replace advice given to you by your health care provider. Make sure you discuss any questions you have with your health care provider. Document Released: 08/04/2007 Document Revised: 09/21/2018 Document Reviewed: 09/21/2018 Elsevier Patient Education  2020 [place] next?  Visit your health care provider once a year for a well check visit.  Ask your health care provider how often you should have your eyes and teeth checked.  Stay up to date on all vaccines. This information is not intended to replace advice given to you by your health care provider. Make sure you discuss any questions you have with your health care provider. Document Released: 09/10/2001 Document Revised: 03/26/2018 Document Reviewed: 03/26/2018 Elsevier  Patient Education  2020 [employer].

## 2019-03-04 NOTE — Progress Notes (Signed)
ANNUAL PREVENTATIVE CARE GYN  ENCOUNTER NOTE  Subjective:       Shelby Long is a 23 y.o. G49P0101 female here for a routine annual gynecologic exam.  Current complaints: 1.  Desires OCP refill 2.  Reports intermittent back pain for the last nine (9) months 3.  Dyspareunia  4.  Bilateral hip pain 5.  Needs Pap smear  Referred to [facility] earlier in the year, but provider does not take insurance.   Denies difficulty breathing or respiratory distress, chest pain, abdominal pain, excessive vaginal bleeding, dysuria, and leg pain or swelling.    Gynecologic History  Patient's last menstrual period was 06/15/2018 (exact date). Period Pattern: (!) Irregular  Contraception: OCP (estrogen/progesterone), Loestrin  Last Pap: 09/2015. Results were: Negative/Negative  Obstetric History  OB History  Gravida Para Term Preterm AB Living  1 1   1   1   SAB TAB Ectopic Multiple Live Births          1    # Outcome Date GA Lbr Len/2nd Weight Sex Delivery Anes PTL Lv  1 Preterm 06/10/16   3 lb 11 oz (1.673 kg) F CS-LTranv  N LIV     Complications: Failure to Progress in First Stage, Severe preeclampsia    Past Medical History:  Diagnosis Date  . PONV (postoperative nausea and vomiting)   . Preeclampsia    Resolved after birth of her daughter     Past Surgical History:  Procedure Laterality Date  . CESAREAN SECTION    . CYST EXCISION N/A    on tailbone  . ESOPHAGOGASTRODUODENOSCOPY (EGD) WITH PROPOFOL N/A 03/17/2017   Procedure: ESOPHAGOGASTRODUODENOSCOPY (EGD) WITH PROPOFOL;  Surgeon: Lollie Sails, MD;  Location: [facility] ENDOSCOPY;  Service: Endoscopy;  Laterality: N/A;  . WISDOM TOOTH EXTRACTION  2015    Current Outpatient Medications on File Prior to Visit  Medication Sig Dispense Refill  . cyclobenzaprine (FLEXERIL) 10 MG tablet Take 1 tablet (10 mg total) by mouth 3 (three) times daily as needed for muscle spasms. 30 tablet 2  . EPINEPHrine 0.3 mg/0.3 mL  IJ SOAJ injection Inject 0.3 mLs (0.3 mg total) into the muscle as needed for anaphylaxis. 1 Device 1  . ibuprofen (ADVIL,MOTRIN) 800 MG tablet Take 1 tablet (800 mg total) by mouth every 8 (eight) hours as needed. 60 tablet 1   No current facility-administered medications on file prior to visit.     Allergies  Allergen Reactions  . Levaquin [Levofloxacin] Anaphylaxis and Swelling  . Other Anaphylaxis    PEANUTS  . Shellfish-Derived Products Anaphylaxis  . Sulfa Antibiotics Anaphylaxis and Swelling  . Clindamycin/Lincomycin Swelling  . Vancomycin Swelling  . Penicillins Swelling and Rash    Social History   Socioeconomic History  . Marital status: Single    Spouse name: Not on file  . Number of children: Not on file  . Years of education: Not on file  . Highest education level: Not on file  Occupational History  . Not on file  Social Needs  . Financial resource strain: Not on file  . Food insecurity    Worry: Not on file    Inability: Not on file  . Transportation needs    Medical: Not on file    Non-medical: Not on file  Tobacco Use  . Smoking status: Never Smoker  . Smokeless tobacco: Never Used  Substance and Sexual Activity  . Alcohol use: Yes    Comment: occasional  . Drug use: No  .  Sexual activity: Yes    Birth control/protection: Condom, Pill  Lifestyle  . Physical activity    Days per week: Not on file    Minutes per session: Not on file  . Stress: Not on file  Relationships  . Social Musicianconnections    Talks on phone: Not on file    Gets together: Not on file    Attends religious service: Not on file    Active member of club or organization: Not on file    Attends meetings of clubs or organizations: Not on file    Relationship status: Not on file  . Intimate partner violence    Fear of current or ex partner: Not on file    Emotionally abused: Not on file    Physically abused: Not on file    Forced sexual activity: Not on file  Other Topics Concern   . Not on file  Social History Narrative  . Not on file    Family History  Problem Relation Age of Onset  . Sjogren's syndrome Mother   . Breast cancer Maternal Grandmother   . Parkinson's disease Maternal Grandfather   . Ovarian cancer Neg Hx   . Colon cancer Neg Hx     The following portions of the patient's history were reviewed and updated as appropriate: allergies, current medications, past family history, past medical history, past social history, past surgical history and problem list.  Review of Systems  ROS negative except as noted above. Information obtained from patient.    Objective:   BP 116/80   Pulse 79   Ht 5\' 9"  (1.753 m)   Wt 227 lb 14.4 oz (103.4 kg)   LMP 06/15/2018 (Exact Date)   BMI 33.65 kg/m    CONSTITUTIONAL: Well-developed, well-nourished female in no acute distress.   PSYCHIATRIC: Normal mood and affect. Normal behavior. Normal judgment and thought content.  NEUROLGIC: Alert and oriented to person, place, and time. Normal muscle tone coordination. No cranial nerve deficit noted.  HENT:  Normocephalic, atraumatic, External right and left ear normal.   EYES: Conjunctivae and EOM are normal. Pupils are equal and round.   NECK: Normal range of motion, supple, no masses.  Normal thyroid.   SKIN: Skin is warm and dry. No rash noted. Not diaphoretic. No erythema. No pallor.  CARDIOVASCULAR: Normal heart rate noted, regular rhythm, no murmur.  RESPIRATORY: Clear to auscultation bilaterally. Effort and breath sounds normal, no problems with respiration noted.  BREASTS: Symmetric in size. No masses, skin changes, nipple drainage, or lymphadenopathy.  ABDOMEN: Soft, normal bowel sounds, no distention noted.  No tenderness, rebound or guarding. Obese.   PELVIC:  External Genitalia: Normal  BUS: Normal  Vagina: Normal  Cervix: Normal, Pap collected  Uterus: Normal  Adnexa: Normal  MUSCULOSKELETAL: Normal range of motion. No tenderness.  No  cyanosis, clubbing, or edema.  2+ distal pulses.  LYMPHATIC: No Axillary, Supraclavicular, or Inguinal Adenopathy.  Assessment:   Annual gynecologic examination 23 y.o.   Contraception: OCP (estrogen/progesterone), Loestrin   Obesity 1   Problem List Items Addressed This Visit    None    Visit Diagnoses    Well woman exam    -  Primary   Low back pain, unspecified back pain laterality, unspecified chronicity, unspecified whether sciatica present       Surveillance for birth control, oral contraceptives          Plan:   Pap: Not needed  Labs: Declined   Routine preventative health  maintenance measures emphasized: Exercise/Diet/Weight control, Tobacco Warnings, Alcohol/Substance use risks and Stress Management; see AVS  Referral to Pelvic Floor Physical Therapy, see orders  Birth control refill provided, see orders  Reviewed red flag symptoms and when to call  RTC x 1 year for ANNUAL EXAM or sooner if needed   Gunnar BullaJenkins Michelle Amoura Ransier, CNM [facility], [facility] 03/04/19 9:37 AM

## 2019-03-09 ENCOUNTER — Ambulatory Visit: Payer: 59 | Attending: Certified Nurse Midwife | Admitting: Physical Therapy

## 2019-03-09 LAB — CYTOLOGY - PAP: Diagnosis: NEGATIVE

## 2019-03-15 ENCOUNTER — Other Ambulatory Visit: Payer: Self-pay | Admitting: Certified Nurse Midwife

## 2019-03-16 ENCOUNTER — Encounter: Payer: 59 | Admitting: Physical Therapy

## 2019-03-23 ENCOUNTER — Encounter: Payer: 59 | Admitting: Physical Therapy

## 2019-03-30 ENCOUNTER — Encounter: Payer: 59 | Admitting: Physical Therapy

## 2019-04-06 ENCOUNTER — Encounter: Payer: 59 | Admitting: Physical Therapy

## 2019-04-13 ENCOUNTER — Encounter: Payer: 59 | Admitting: Physical Therapy

## 2019-04-20 ENCOUNTER — Encounter: Payer: 59 | Admitting: Physical Therapy

## 2019-04-27 ENCOUNTER — Encounter: Payer: 59 | Admitting: Physical Therapy

## 2019-05-04 ENCOUNTER — Encounter: Payer: 59 | Admitting: Physical Therapy

## 2019-07-29 ENCOUNTER — Other Ambulatory Visit: Payer: 59

## 2019-09-22 ENCOUNTER — Other Ambulatory Visit: Payer: Self-pay

## 2019-09-22 ENCOUNTER — Telehealth: Payer: Self-pay | Admitting: Certified Nurse Midwife

## 2019-09-22 MED ORDER — NORETHINDRONE ACET-ETHINYL EST 1-20 MG-MCG PO TABS: 1.0000 | ORAL_TABLET | Freq: Every day | ORAL | 2 refills | Status: DC

## 2019-09-22 NOTE — Telephone Encounter (Signed)
Called and spoke with patient, her insurance is no longer accepted at [facility] so she request new prescription [facility] sent to [facility] for [facility].  [facility] and patient verbalized understanding.

## 2019-09-22 NOTE — Telephone Encounter (Signed)
Patient called saying she needs to change the [facility] [address] in [place] to get her birth control.  -TC

## 2020-01-11 ENCOUNTER — Telehealth: Payer: Self-pay | Admitting: Certified Nurse Midwife

## 2020-01-11 NOTE — Telephone Encounter (Signed)
Pt called in and stated that she is having lower back pain. The pt stated that it started mothers day weekend, and that it has been going on and off. The pt stated that she has spoke to you about it in the past the pt is wanting to be seen this week. I told the pt that you are out of the office till Thursday. I informed the pt that I would send you a message. Please advise

## 2020-01-11 NOTE — Telephone Encounter (Signed)
May schedule an appointment for later this week. Thanks, JML

## 2020-01-12 NOTE — Telephone Encounter (Signed)
Called pt and informed her that we can add it on for Friday. The pt verbally understood.

## 2020-01-14 ENCOUNTER — Encounter: Payer: Self-pay | Admitting: Certified Nurse Midwife

## 2020-01-14 ENCOUNTER — Other Ambulatory Visit: Payer: Self-pay

## 2020-01-14 ENCOUNTER — Ambulatory Visit (INDEPENDENT_AMBULATORY_CARE_PROVIDER_SITE_OTHER): Payer: 59 | Admitting: Certified Nurse Midwife

## 2020-01-14 VITALS — BP 136/91 | HR 81 | Ht 69.0 in | Wt 221.6 lb

## 2020-01-14 DIAGNOSIS — G8929 Other chronic pain: Secondary | ICD-10-CM | POA: Diagnosis not present

## 2020-01-14 DIAGNOSIS — M5441 Lumbago with sciatica, right side: Secondary | ICD-10-CM | POA: Diagnosis not present

## 2020-01-14 DIAGNOSIS — Z842 Family history of other diseases of the genitourinary system: Secondary | ICD-10-CM

## 2020-01-14 LAB — POCT URINALYSIS DIPSTICK
Bilirubin, UA: NEGATIVE
Glucose, UA: NEGATIVE
Ketones, UA: NEGATIVE
Leukocytes, UA: NEGATIVE
Nitrite, UA: NEGATIVE
Protein, UA: NEGATIVE
Spec Grav, UA: <=1.005 — AB (ref 1.010–1.025)
Urobilinogen, UA: 0.2 E.U./dL
pH, UA: 5 (ref 5.0–8.0)

## 2020-01-14 NOTE — Progress Notes (Signed)
GYN ENCOUNTER NOTE  Subjective:       Shelby Long is a 24 y.o. G70P0101 female is here for gynecologic evaluation of the following issues:  1. Back pain-seen in UC yesterday; given steriod taper has yet to start 2. Desires further workup to diagnosis endometriosis  Denies difficulty breathing or respiratory distress, chest pain, abdominal pain, excessive vaginal bleeding, dysuria, leg pain or swelling   Gynecologic History  No LMP recorded. (Menstrual status: Irregular Periods).   Contraception: OCP (estrogen/progesterone)   Last Pap: 03/04/2019. Results were: Neg  Obstetric History  OB History  Gravida Para Term Preterm AB Living  1 1   1   1   SAB TAB Ectopic Multiple Live Births          1    # Outcome Date GA Lbr Len/2nd Weight Sex Delivery Anes PTL Lv  1 Preterm 06/10/16   3 lb 11 oz (1.673 kg) F CS-LTranv  N LIV     Complications: Failure to Progress in First Stage, Severe preeclampsia    Past Medical History:  Diagnosis Date  . PONV (postoperative nausea and vomiting)   . Preeclampsia    Resolved after birth of her daughter     Past Surgical History:  Procedure Laterality Date  . CESAREAN SECTION    . CYST EXCISION N/A    on tailbone  . ESOPHAGOGASTRODUODENOSCOPY (EGD) WITH PROPOFOL N/A 03/17/2017   Procedure: ESOPHAGOGASTRODUODENOSCOPY (EGD) WITH PROPOFOL;  Surgeon: 03/19/2017, MD;  Location: [facility] ENDOSCOPY;  Service: Endoscopy;  Laterality: N/A;  . WISDOM TOOTH EXTRACTION  2015    Current Outpatient Medications on File Prior to Visit  Medication Sig Dispense Refill  . baclofen (LIORESAL) 10 MG tablet Take 10 mg by mouth 3 (three) times daily.    2016 EPINEPHrine 0.3 mg/0.3 mL IJ SOAJ injection Inject 0.3 mLs (0.3 mg total) into the muscle as needed for anaphylaxis. 1 Device 1  . ibuprofen (ADVIL,MOTRIN) 800 MG tablet Take 1 tablet (800 mg total) by mouth every 8 (eight) hours as needed. 60 tablet 1  . norethindrone-ethinyl estradiol (LOESTRIN) 1-20  MG-MCG tablet Take 1 tablet by mouth daily. 3 Package 2  . predniSONE (DELTASONE) 20 MG tablet Take 20 mg by mouth daily.    . cyclobenzaprine (FLEXERIL) 10 MG tablet Take 1 tablet (10 mg total) by mouth 3 (three) times daily as needed for muscle spasms. (Patient not taking: Reported on 01/14/2020) 30 tablet 2   No current facility-administered medications on file prior to visit.    Allergies  Allergen Reactions  . Levaquin [Levofloxacin] Anaphylaxis and Swelling  . Other Anaphylaxis    PEANUTS  . Shellfish-Derived Products Anaphylaxis  . Sulfa Antibiotics Anaphylaxis and Swelling  . Clindamycin/Lincomycin Swelling  . Vancomycin Swelling  . Penicillins Swelling and Rash    Social History   Socioeconomic History  . Marital status: Single    Spouse name: Not on file  . Number of children: Not on file  . Years of education: Not on file  . Highest education level: Not on file  Occupational History  . Not on file  Tobacco Use  . Smoking status: Never Smoker  . Smokeless tobacco: Never Used  Vaping Use  . Vaping Use: Every day  Substance and Sexual Activity  . Alcohol use: Yes    Comment: occasional  . Drug use: No  . Sexual activity: Yes    Birth control/protection: Condom, Pill  Other Topics Concern  . Not on file  Social History Narrative  . Not on file   Social Determinants of Health   Financial Resource Strain:   . Difficulty of Paying Living Expenses:   Food Insecurity:   . Worried About Charity fundraiser in the Last Year:   . Arboriculturist in the Last Year:   Transportation Needs:   . Film/video editor (Medical):   Marland Kitchen Lack of Transportation (Non-Medical):   Physical Activity:   . Days of Exercise per Week:   . Minutes of Exercise per Session:   Stress:   . Feeling of Stress :   Social Connections:   . Frequency of Communication with Friends and Family:   . Frequency of Social Gatherings with Friends and Family:   . Attends Religious Services:    . Active Member of Clubs or Organizations:   . Attends Archivist Meetings:   Marland Kitchen Marital Status:   Intimate Partner Violence:   . Fear of Current or Ex-Partner:   . Emotionally Abused:   Marland Kitchen Physically Abused:   . Sexually Abused:     Family History  Problem Relation Age of Onset  . Sjogren's syndrome Mother   . Breast cancer Maternal Grandmother   . Parkinson's disease Maternal Grandfather   . Ovarian cancer Neg Hx   . Colon cancer Neg Hx     The following portions of the patient's history were reviewed and updated as appropriate: allergies, current medications, past family history, past medical history, past social history, past surgical history and problem list.  Review of Systems  ROS- negative except as noted above. Information obtained from patient.   Objective:   BP (!) 136/91   Pulse 81   Ht 5\' 9"  (1.753 m)   Wt 221 lb 9 oz (100.5 kg)   BMI 32.72 kg/m    CONSTITUTIONAL: Well-developed, well-nourished female in no acute distress.   PHYSICAL EXAM: Not Indicated  Recent Results (from the past 2160 hour(s))  POCT urinalysis dipstick     Status: Abnormal   Collection Time: 01/14/20  4:40 PM  Result Value Ref Range   Color, UA yellow    Clarity, UA clear    Glucose, UA Negative Negative   Bilirubin, UA neg    Ketones, UA neg    Spec Grav, UA <=1.005 (A) 1.010 - 1.025   Blood, UA hemplyzed trace    pH, UA 5.0 5.0 - 8.0   Protein, UA Negative Negative   Urobilinogen, UA 0.2 0.2 or 1.0 E.U./dL   Nitrite, UA neg    Leukocytes, UA Negative Negative   Appearance     Odor      Assessment:   1. Chronic midline low back pain with right-sided sciatica   2. Family history of endometriosis   Plan:   Encouraged patient to take steroid taper and flexeril given by Urgent care. See chart   Advised she could use lidocaine patch prn for pain.   Discussed diagnosis of endometriosis. Handouts provided.  Discussed [facility] with patient: Handout provided.  Patient will follow up to advise if she would like to try this.  Reviewed red flag symptoms and when to call the office.  RTC x 2 months for ANNUAL EXAM or sooner if needed.  Fransico Him RN [facility] 01/14/20 3:52 PM

## 2020-01-14 NOTE — Patient Instructions (Addendum)
Diagnostic Laparoscopy Diagnostic laparoscopy is a procedure to diagnose diseases in the abdomen. It might be done for a variety of reasons, such as to look for scar tissue, cancer, or a reason for abdomen (abdominal) pain. During the procedure, a thin, flexible tube that has a light and a camera on the end (laparoscope) is inserted through an incision in the abdomen. The image from the camera is shown on a monitor to help your surgeon see inside your body. Tell a health care provider about:  Any allergies you have.  All medicines you are taking, including vitamins, herbs, eye drops, creams, and over-the-counter medicines.  Any problems you or family members have had with anesthetic medicines.  Any blood disorders you have.  Any surgeries you have had.  Any medical conditions you have. What are the risks? Generally, this is a safe procedure. However, problems may occur, including:  Infection.  Bleeding.  Allergic reactions to medicines or dyes.  Damage to abdominal structures or organs, such as the intestines, liver, stomach, or spleen. What happens before the procedure? Medicines  Ask your health care provider about: ? Changing or stopping your regular medicines. This is especially important if you are taking diabetes medicines or blood thinners. ? Taking medicines such as aspirin and ibuprofen. These medicines can thin your blood. Do not take these medicines unless your health care provider tells you to take them. ? Taking over-the-counter medicines, vitamins, herbs, and supplements.  You may be given antibiotic medicine to help prevent infection. Staying hydrated Follow instructions from your health care provider about hydration, which may include:  Up to 2 hours before the procedure - you may continue to drink clear liquids, such as water, clear fruit juice, black coffee, and plain tea. Eating and drinking restrictions Follow instructions from your health care provider  about eating and drinking, which may include:  8 hours before the procedure - stop eating heavy meals or foods such as meat, fried foods, or fatty foods.  6 hours before the procedure - stop eating light meals or foods, such as toast or cereal.  6 hours before the procedure - stop drinking milk or drinks that contain milk.  2 hours before the procedure - stop drinking clear liquids. General instructions  Ask your health care provider how your surgical site will be marked or identified.  You may be asked to shower with a germ-killing soap.  Plan to have someone take you home from the hospital or clinic.  Plan to have a responsible adult care for you for at least 24 hours after you leave the hospital or clinic. This is important. What happens during the procedure?   To lower your risk of infection: ? Your health care team will wash or sanitize their hands. ? Hair may be removed from the surgical area. ? Your skin will be washed with soap.  An IV will be inserted into one of your veins.  You will be given a medicine to make you fall asleep (general anesthetic). You may also be given a medicine to help you relax (sedative).  A breathing tube will be placed down your throat to help you breathe during the procedure.  Your abdomen will be filled with an air-like gas so it expands. This will give the surgeon more room to operate and will make your organs easier to see.  Many small incisions will be made in your abdomen.  A laparoscope and other surgical instruments will be inserted into your abdomen through the   incisions.  A tissue sample may be removed from an organ for examination (biopsy). This will depend on the reason why you are having this procedure.  The laparoscope and other instruments will be removed from your abdomen.  The gas will be released.  Your incisions will be closed with stitches (sutures) and covered with a bandage (dressing).  Your breathing tube will be  removed. The procedure may vary among health care providers and hospitals. What happens after the procedure?   Your blood pressure, heart rate, breathing rate, and blood oxygen level will be monitored until the medicines you were given have worn off.  Do not drive for 24 hours if you were given a sedative during your procedure.  It is up to you to get the results of your procedure. Ask your health care provider, or the department that is doing the procedure, when your results will be ready. Summary  Diagnostic laparoscopy is a way to look for problems in the abdomen using small incisions.  Follow instructions from your health care provider about how to prepare for the procedure.  Plan to have a responsible adult care for you for at least 24 hours after you leave the hospital or clinic. This is important. This information is not intended to replace advice given to you by your health care provider. Make sure you discuss any questions you have with your health care provider. Document Revised: 06/27/2017 Document Reviewed: 01/08/2017 Elsevier Patient Education  [employer].   Endometriosis  Endometriosis is a condition in which the tissue that lines the uterus (endometrium) grows outside of its normal location. The tissue may grow in many locations close to the uterus, but it commonly grows on the ovaries, fallopian tubes, vagina, or bowel. When the uterus sheds the endometrium every menstrual cycle, there is bleeding wherever the endometrial tissue is located. This can cause pain because blood is irritating to tissues that are not normally exposed to it. What are the causes? The cause of endometriosis is not known. What increases the risk? You may be more likely to develop endometriosis if you:  Have a family history of endometriosis.  Have never given birth.  Started your period at age 21 or younger.  Have high levels of estrogen in your body.  Were exposed to a certain  medicine (diethylstilbestrol) before you were born (in utero).  Had low birth weight.  Were born as a twin, triplet, or other multiple.  Have a BMI of less than 25. BMI is an estimate of body fat and is calculated from height and weight. What are the signs or symptoms? Often, there are no symptoms of this condition. If you do have symptoms, they may:  Vary depending on where your endometrial tissue is growing.  Occur during your menstrual period (most common) or midcycle.  Come and go, or you may go months with no symptoms at all.  Stop with menopause. Symptoms may include:  Pain in the back or abdomen.  Heavier bleeding during periods.  Pain during sex.  Painful bowel movements.  Infertility.  Pelvic pain.  Bleeding more than once a month. How is this diagnosed? This condition is diagnosed based on your symptoms and a physical exam. You may have tests, such as:  Blood tests and urine tests. These may be done to help rule out other possible causes of your symptoms.  Ultrasound, to look for abnormal tissues.  An X-ray of the lower bowel (barium enema).  An ultrasound that is done  through the vagina (transvaginally).  CT scan.  MRI.  Laparoscopy. In this procedure, a lighted, pencil-sized instrument called a laparoscope is inserted into your abdomen through an incision. The laparoscope allows your health care provider to look at the organs inside your body and check for abnormal tissue to confirm the diagnosis. If abnormal tissue is found, your health care provider may remove a small piece of tissue (biopsy) to be examined under a microscope. How is this treated? Treatment for this condition may include:  Medicines to relieve pain, such as NSAIDs.  Hormone therapy. This involves using artificial (synthetic) hormones to reduce endometrial tissue growth. Your health care provider may recommend using a hormonal form of birth control, or other medicines.  Surgery.  This may be done to remove abnormal endometrial tissue. ? In some cases, tissue may be removed using a laparoscope and a laser (laparoscopic laser treatment). ? In severe cases, surgery may be done to remove the fallopian tubes, uterus, and ovaries (hysterectomy). Follow these instructions at home:  Take over-the-counter and prescription medicines only as told by your health care provider.  Do not drive or use heavy machinery while taking prescription pain medicine.  Try to avoid activities that cause pain, including sexual activity.  Keep all follow-up visits as told by your health care provider. This is important. Contact a health care provider if:  You have pain in the area between your hip bones (pelvic area) that occurs: ? Before, during, or after your period. ? In between your period and gets worse during your period. ? During or after sex. ? With bowel movements or urination, especially during your period.  You have problems getting pregnant.  You have a fever. Get help right away if:  You have severe pain that does not get better with medicine.  You have severe nausea and vomiting, or you cannot eat without vomiting.  You have pain that affects only the lower, right side of your abdomen.  You have abdominal pain that gets worse.  You have abdominal swelling.  You have blood in your stool. This information is not intended to replace advice given to you by your health care provider. Make sure you discuss any questions you have with your health care provider. Document Revised: 06/27/2017 Document Reviewed: 12/16/2015 Elsevier Patient Education  2020 [employer]   Chronic Back Pain When back pain lasts longer than 3 months, it is called chronic back pain. Pain may get worse at certain times (flare-ups). There are things you can do at home to manage your pain. Follow these instructions at home: Activity      Avoid bending and other activities that make pain  worse.  When standing: ? Keep your upper back and neck straight. ? Keep your shoulders pulled back. ? Avoid slouching.  When sitting: ? Keep your back straight. ? Relax your shoulders. Do not round your shoulders or pull them backward.  Do not sit or stand in one place for long periods of time.  Take short rest breaks during the day. Lying down or standing is usually better than sitting. Resting can help relieve pain.  When sitting or lying down for a long time, do some mild activity or stretching. This will help to prevent stiffness and pain.  Get regular exercise. Ask your doctor what activities are safe for you.  Do not lift anything that is heavier than 10 lb (4.5 kg). To prevent injury when you lift things: ? Bend your knees. ? Keep the  weight close to your body. ? Avoid twisting. Managing pain  If told, put ice on the painful area. Your doctor may tell you to use ice for 24-48 hours after a flare-up starts. ? Put ice in a plastic bag. ? Place a towel between your skin and the bag. ? Leave the ice on for 20 minutes, 2-3 times a day.  If told, put heat on the painful area as often as told by your doctor. Use the heat source that your doctor recommends, such as a moist heat pack or a heating pad. ? Place a towel between your skin and the heat source. ? Leave the heat on for 20-30 minutes. ? Remove the heat if your skin turns bright red. This is especially important if you are unable to feel pain, heat, or cold. You may have a greater risk of getting burned.  Soak in a warm bath. This can help relieve pain.  Take over-the-counter and prescription medicines only as told by your doctor. General instructions  Sleep on a firm mattress. Try lying on your side with your knees slightly bent. If you lie on your back, put a pillow under your knees.  Keep all follow-up visits as told by your doctor. This is important. Contact a doctor if:  You have pain that does not get better  with rest or medicine. Get help right away if:  One or both of your arms or legs feel weak.  One or both of your arms or legs lose feeling (numbness).  You have trouble controlling when you poop (bowel movement) or pee (urinate).  You feel sick to your stomach (nauseous).  You throw up (vomit).  You have belly (abdominal) pain.  You have shortness of breath.  You pass out (faint). Summary  When back pain lasts longer than 3 months, it is called chronic back pain.  Pain may get worse at certain times (flare-ups).  Use ice and heat as told by your doctor. Your doctor may tell you to use ice after flare-ups. This information is not intended to replace advice given to you by your health care provider. Make sure you discuss any questions you have with your health care provider. Document Revised: 11/05/2018 Document Reviewed: 02/27/2017 Elsevier Patient Education  2020 [employer].

## 2020-01-15 NOTE — Progress Notes (Signed)
I have seen, interviewed, and examined the patient in conjunction with the [employer] Nurse Practitioner student and affirm the diagnosis and management plan.   Gunnar Bulla, CNM [facility], [facility] 01/15/20 12:34 PM

## 2020-01-16 LAB — URINE CULTURE

## 2020-03-06 ENCOUNTER — Encounter: Payer: 59 | Admitting: Certified Nurse Midwife

## 2020-03-22 ENCOUNTER — Other Ambulatory Visit: Payer: Self-pay | Admitting: Certified Nurse Midwife

## 2020-06-02 NOTE — Progress Notes (Signed)
Pt present for annual exam. Pt stated that she was doing well and denies any gyn issues at this time. Pt declined flu vaccine at this time.

## 2020-06-02 NOTE — Patient Instructions (Addendum)
Blood Pressure Record Sheet To take your blood pressure, you will need a blood pressure machine. You can buy a blood pressure machine (blood pressure monitor) at your clinic, drug store, or online. When choosing one, consider:  An automatic monitor that has an arm cuff.  A cuff that wraps snugly around your upper arm. You should be able to fit only one finger between your arm and the cuff.  A device that stores blood pressure reading results.  Do not choose a monitor that measures your blood pressure from your wrist or finger. Follow your health care provider's instructions for how to take your blood pressure. To use this form:  Get one reading in the morning (a.m.) before you take any medicines.  Get one reading in the evening (p.m.) before supper.  Take at least 2 readings with each blood pressure check. This makes sure the results are correct. Wait 1-2 minutes between measurements.  Write down the results in the spaces on this form.  Repeat this once a week, or as told by your health care provider.  Make a follow-up appointment with your health care provider to discuss the results. Blood pressure log Date: _______________________  a.m. _____________________(1st reading) _____________________(2nd reading)  p.m. _____________________(1st reading) _____________________(2nd reading) Date: _______________________  a.m. _____________________(1st reading) _____________________(2nd reading)  p.m. _____________________(1st reading) _____________________(2nd reading) Date: _______________________  a.m. _____________________(1st reading) _____________________(2nd reading)  p.m. _____________________(1st reading) _____________________(2nd reading) Date: _______________________  a.m. _____________________(1st reading) _____________________(2nd reading)  p.m. _____________________(1st reading) _____________________(2nd reading) Date: _______________________  a.m.  _____________________(1st reading) _____________________(2nd reading)  p.m. _____________________(1st reading) _____________________(2nd reading) This information is not intended to replace advice given to you by your health care provider. Make sure you discuss any questions you have with your health care provider. Document Revised: 09/12/2017 Document Reviewed: 07/15/2017 Elsevier Patient Education  2020 [place] 73-87 Years Old, Female Preventive care refers to visits with your health care provider and lifestyle choices that can promote health and wellness. This includes:  A yearly physical exam. This may also be called an annual well check.  Regular dental visits and eye exams.  Immunizations.  Screening for certain conditions.  Healthy lifestyle choices, such as eating a healthy diet, getting regular exercise, not using drugs or products that contain nicotine and tobacco, and limiting alcohol use. What can I expect for my preventive care visit? Physical exam Your health care provider will check your:  Height and weight. This may be used to calculate body mass index (BMI), which tells if you are at a healthy weight.  Heart rate and blood pressure.  Skin for abnormal spots. Counseling Your health care provider may ask you questions about your:  Alcohol, tobacco, and drug use.  Emotional well-being.  Home and relationship well-being.  Sexual activity.  Eating habits.  Work and work Statistician.  Method of birth control.  Menstrual cycle.  Pregnancy history. What immunizations do I need?  Influenza (flu) vaccine  This is recommended every year. Tetanus, diphtheria, and pertussis (Tdap) vaccine  You may need a Td booster every 10 years. Varicella (chickenpox) vaccine  You may need this if you have not been vaccinated. Human papillomavirus (HPV) vaccine  If recommended by your health care provider, you may need three doses over 6  months. Measles, mumps, and rubella (MMR) vaccine  You may need at least one dose of MMR. You may also need a second dose. Meningococcal conjugate (MenACWY) vaccine  One dose is recommended if you are age 49-21  years and a Market researcher living in a residence hall, or if you have one of several medical conditions. You may also need additional booster doses. Pneumococcal conjugate (PCV13) vaccine  You may need this if you have certain conditions and were not previously vaccinated. Pneumococcal polysaccharide (PPSV23) vaccine  You may need one or two doses if you smoke cigarettes or if you have certain conditions. Hepatitis A vaccine  You may need this if you have certain conditions or if you travel or work in places where you may be exposed to hepatitis A. Hepatitis B vaccine  You may need this if you have certain conditions or if you travel or work in places where you may be exposed to hepatitis B. Haemophilus influenzae type b (Hib) vaccine  You may need this if you have certain conditions. You may receive vaccines as individual doses or as more than one vaccine together in one shot (combination vaccines). Talk with your health care provider about the risks and benefits of combination vaccines. What tests do I need?  Blood tests  Lipid and cholesterol levels. These may be checked every 5 years starting at age 33.  Hepatitis C test.  Hepatitis B test. Screening  Diabetes screening. This is done by checking your blood sugar (glucose) after you have not eaten for a while (fasting).  Sexually transmitted disease (STD) testing.  BRCA-related cancer screening. This may be done if you have a family history of breast, ovarian, tubal, or peritoneal cancers.  Pelvic exam and Pap test. This may be done every 3 years starting at age 35. Starting at age 85, this may be done every 5 years if you have a Pap test in combination with an HPV test. Talk with your health care  provider about your test results, treatment options, and if necessary, the need for more tests. Follow these instructions at home: Eating and drinking   Eat a diet that includes fresh fruits and vegetables, whole grains, lean protein, and low-fat dairy.  Take vitamin and mineral supplements as recommended by your health care provider.  Do not drink alcohol if: ? Your health care provider tells you not to drink. ? You are pregnant, may be pregnant, or are planning to become pregnant.  If you drink alcohol: ? Limit how much you have to 0-1 drink a day. ? Be aware of how much alcohol is in your drink. In the [place] one drink equals one 12 oz bottle of beer (355 mL), one 5 oz glass of wine (148 mL), or one 1 oz glass of hard liquor (44 mL). Lifestyle  Take daily care of your teeth and gums.  Stay active. Exercise for at least 30 minutes on 5 or more days each week.  Do not use any products that contain nicotine or tobacco, such as cigarettes, e-cigarettes, and chewing tobacco. If you need help quitting, ask your health care provider.  If you are sexually active, practice safe sex. Use a condom or other form of birth control (contraception) in order to prevent pregnancy and STIs (sexually transmitted infections). If you plan to become pregnant, see your health care provider for a preconception visit. What's next?  Visit your health care provider once a year for a well check visit.  Ask your health care provider how often you should have your eyes and teeth checked.  Stay up to date on all vaccines. This information is not intended to replace advice given to you by your health care provider. Make sure  you discuss any questions you have with your health care provider. Document Revised: 03/26/2018 Document Reviewed: 03/26/2018 Elsevier Patient Education  2020 [place] Breast self-awareness is knowing how your breasts look and feel. Doing breast self-awareness  is important. It allows you to catch a breast problem early while it is still small and can be treated. All women should do breast self-awareness, including women who have had breast implants. Tell your doctor if you notice a change in your breasts. What you need:  A mirror.  A well-lit room. How to do a breast self-exam A breast self-exam is one way to learn what is normal for your breasts and to check for changes. To do a breast self-exam: Look for changes  1. Take off all the clothes above your waist. 2. Stand in front of a mirror in a room with good lighting. 3. Put your hands on your hips. 4. Push your hands down. 5. Look at your breasts and nipples in the mirror to see if one breast or nipple looks different from the other. Check to see if: ? The shape of one breast is different. ? The size of one breast is different. ? There are wrinkles, dips, and bumps in one breast and not the other. 6. Look at each breast for changes in the skin, such as: ? Redness. ? Scaly areas. 7. Look for changes in your nipples, such as: ? Liquid around the nipples. ? Bleeding. ? Dimpling. ? Redness. ? A change in where the nipples are. Feel for changes  1. Lie on your back on the floor. 2. Feel each breast. To do this, follow these steps: ? Pick a breast to feel. ? Put the arm closest to that breast above your head. ? Use your other arm to feel the nipple area of your breast. Feel the area with the pads of your three middle fingers by making small circles with your fingers. For the first circle, press lightly. For the second circle, press harder. For the third circle, press even harder. ? Keep making circles with your fingers at the different pressures as you move down your breast. Stop when you feel your ribs. ? Move your fingers a little toward the center of your body. ? Start making circles with your fingers again, this time going up until you reach your collarbone. ? Keep making up-and-down  circles until you reach your armpit. Remember to keep using the three pressures. ? Feel the other breast in the same way. 3. Sit or stand in the tub or shower. 4. With soapy water on your skin, feel each breast the same way you did in step 2 when you were lying on the floor. Write down what you find Writing down what you find can help you remember what to tell your doctor. Write down:  What is normal for each breast.  Any changes you find in each breast, including: ? The kind of changes you find. ? Whether you have pain. ? Size and location of any lumps.  When you last had your menstrual period. General tips  Check your breasts every month.  If you are breastfeeding, the best time to check your breasts is after you feed your baby or after you use a breast pump.  If you get menstrual periods, the best time to check your breasts is 5-7 days after your menstrual period is over.  With time, you will become comfortable with the self-exam, and you will begin to know  if there are changes in your breasts. Contact a doctor if you:  See a change in the shape or size of your breasts or nipples.  See a change in the skin of your breast or nipples, such as red or scaly skin.  Have fluid coming from your nipples that is not normal.  Find a lump or thick area that was not there before.  Have pain in your breasts.  Have any concerns about your breast health. Summary  Breast self-awareness includes looking for changes in your breasts, as well as feeling for changes within your breasts.  Breast self-awareness should be done in front of a mirror in a well-lit room.  You should check your breasts every month. If you get menstrual periods, the best time to check your breasts is 5-7 days after your menstrual period is over.  Let your doctor know of any changes you see in your breasts, including changes in size, changes on the skin, pain or tenderness, or fluid from your nipples that is not  normal. This information is not intended to replace advice given to you by your health care provider. Make sure you discuss any questions you have with your health care provider. Document Revised: 03/03/2018 Document Reviewed: 03/03/2018 Elsevier Patient Education  [place].

## 2020-06-05 ENCOUNTER — Ambulatory Visit (INDEPENDENT_AMBULATORY_CARE_PROVIDER_SITE_OTHER): Payer: 59 | Admitting: Certified Nurse Midwife

## 2020-06-05 ENCOUNTER — Encounter: Payer: Self-pay | Admitting: Certified Nurse Midwife

## 2020-06-05 ENCOUNTER — Other Ambulatory Visit: Payer: Self-pay

## 2020-06-05 VITALS — BP 134/94 | HR 79 | Ht 69.0 in | Wt 230.8 lb

## 2020-06-05 DIAGNOSIS — Z3041 Encounter for surveillance of contraceptive pills: Secondary | ICD-10-CM | POA: Diagnosis not present

## 2020-06-05 DIAGNOSIS — Z01419 Encounter for gynecological examination (general) (routine) without abnormal findings: Secondary | ICD-10-CM

## 2020-06-05 MED ORDER — NORETHINDRONE ACET-ETHINYL EST 1-20 MG-MCG PO TABS: 1.0000 | ORAL_TABLET | Freq: Every day | ORAL | 0 refills | Status: DC

## 2020-06-05 NOTE — Progress Notes (Signed)
ANNUAL PREVENTATIVE CARE GYN  ENCOUNTER NOTE  Subjective:       Shelby Long is a 24 y.o. G18P0101 female here for a routine annual gynecologic exam.  Current complaints: 1. Occasional pain with intercourse that was helped by pelvic floor physical therapy, but is likely due to hip misalignment-see chiropractor in [facility]  Denies difficulty breathing or respiratory distress, chest pain, abdominal pain, excessive vaginal bleeding, dysuria, and leg pain or swelling.    Gynecologic History  No LMP recorded (lmp unknown). (Menstrual status: Irregular Periods).   Upstream - 06/05/20 0931      Pregnancy Intention Screening   Does the patient want to become pregnant in the next year? No    Does the patient's partner want to become pregnant in the next year? No    Would the patient like to discuss contraceptive options today? No      Contraception Wrap Up   Current Method Oral Contraceptive          The pregnancy intention screening data noted above was reviewed. Potential methods of contraception were discussed. The patient elected to proceed with Oral Contraceptive.   Last Pap: 02/2019. Results were: normal  Obstetric History  OB History  Gravida Para Term Preterm AB Living  1 1   1   1   SAB TAB Ectopic Multiple Live Births          1    # Outcome Date GA Lbr Len/2nd Weight Sex Delivery Anes PTL Lv  1 Preterm 06/10/16   3 lb 11 oz (1.673 kg) F CS-LTranv  N LIV     Complications: Failure to Progress in First Stage, Severe preeclampsia    Past Medical History:  Diagnosis Date  . PONV (postoperative nausea and vomiting)   . Preeclampsia    Resolved after birth of her daughter     Past Surgical History:  Procedure Laterality Date  . CESAREAN SECTION    . CYST EXCISION N/A    on tailbone  . ESOPHAGOGASTRODUODENOSCOPY (EGD) WITH PROPOFOL N/A 03/17/2017   Procedure: ESOPHAGOGASTRODUODENOSCOPY (EGD) WITH PROPOFOL;  Surgeon: 03/19/2017, MD;  Location: [facility] ENDOSCOPY;   Service: Endoscopy;  Laterality: N/A;  . WISDOM TOOTH EXTRACTION  2015    Current Outpatient Medications on File Prior to Visit  Medication Sig Dispense Refill  . EPINEPHrine 0.3 mg/0.3 mL IJ SOAJ injection Inject 0.3 mLs (0.3 mg total) into the muscle as needed for anaphylaxis. 1 Device 1  . ibuprofen (ADVIL,MOTRIN) 800 MG tablet Take 1 tablet (800 mg total) by mouth every 8 (eight) hours as needed. 60 tablet 1  . JUNEL 1/20 1-20 MG-MCG tablet TAKE 1 TABLET BY MOUTH EVERY DAY 63 tablet 1   No current facility-administered medications on file prior to visit.    Allergies  Allergen Reactions  . Levaquin [Levofloxacin] Anaphylaxis and Swelling  . Other Anaphylaxis    PEANUTS  . Shellfish-Derived Products Anaphylaxis  . Sulfa Antibiotics Anaphylaxis and Swelling  . Clindamycin/Lincomycin Swelling  . Vancomycin Swelling  . Penicillins Swelling and Rash    Social History   Socioeconomic History  . Marital status: Single    Spouse name: Not on file  . Number of children: Not on file  . Years of education: Not on file  . Highest education level: Not on file  Occupational History  . Not on file  Tobacco Use  . Smoking status: Never Smoker  . Smokeless tobacco: Never Used  Vaping Use  . Vaping Use: Every  day  Substance and Sexual Activity  . Alcohol use: Yes    Comment: occasional  . Drug use: No  . Sexual activity: Yes    Birth control/protection: Condom, Pill  Other Topics Concern  . Not on file  Social History Narrative  . Not on file   Social Determinants of Health   Financial Resource Strain:   . Difficulty of Paying Living Expenses: Not on file  Food Insecurity:   . Worried About Programme researcher, broadcasting/film/video in the Last Year: Not on file  . Ran Out of Food in the Last Year: Not on file  Transportation Needs:   . Lack of Transportation (Medical): Not on file  . Lack of Transportation (Non-Medical): Not on file  Physical Activity:   . Days of Exercise per Week: Not on  file  . Minutes of Exercise per Session: Not on file  Stress:   . Feeling of Stress : Not on file  Social Connections:   . Frequency of Communication with Friends and Family: Not on file  . Frequency of Social Gatherings with Friends and Family: Not on file  . Attends Religious Services: Not on file  . Active Member of Clubs or Organizations: Not on file  . Attends Banker Meetings: Not on file  . Marital Status: Not on file  Intimate Partner Violence:   . Fear of Current or Ex-Partner: Not on file  . Emotionally Abused: Not on file  . Physically Abused: Not on file  . Sexually Abused: Not on file    Family History  Problem Relation Age of Onset  . Sjogren's syndrome Mother   . Breast cancer Maternal Grandmother   . Parkinson's disease Maternal Grandfather   . Ovarian cancer Neg Hx   . Colon cancer Neg Hx     The following portions of the patient's history were reviewed and updated as appropriate: allergies, current medications, past family history, past medical history, past social history, past surgical history and problem list.  Review of Systems  ROS negative except as noted above. Information obtained from patient.    Objective:   BP (!) 134/94   Pulse 79   Ht 5\' 9"  (1.753 m)   Wt 230 lb 12.8 oz (104.7 kg)   LMP  (LMP Unknown)   BMI 34.08 kg/m    CONSTITUTIONAL: Well-developed, well-nourished female in no acute distress.   PSYCHIATRIC: Normal mood and affect. Normal behavior. Normal judgment and thought content.  NEUROLGIC: Alert and oriented to person, place, and time. Normal muscle tone coordination. No cranial nerve deficit noted.  HENT:  Normocephalic, atraumatic, External right and left ear normal  EYES: Conjunctivae and EOM are normal. Pupils are equal and round.   NECK: Normal range of motion, supple, no masses.  Normal thyroid.   SKIN: Skin is warm and dry. No rash noted. Not diaphoretic. No erythema. No pallor. Professional tattoos  present.   CARDIOVASCULAR: Normal heart rate noted, regular rhythm, no murmur.  RESPIRATORY: Clear to auscultation bilaterally. Effort and breath sounds normal, no problems with respiration noted.  BREASTS: Symmetric in size. No masses, skin changes, nipple drainage, or lymphadenopathy.  ABDOMEN: Soft, normal bowel sounds, no distention noted.  No tenderness, rebound or guarding.   PELVIC:  External Genitalia: Normal  Vagina: Normal  Cervix: Normal  Uterus: Normal  Adnexa: Normal  MUSCULOSKELETAL: Normal range of motion. No tenderness.  No cyanosis, clubbing, or edema.  2+ distal pulses.  LYMPHATIC: No Axillary, Supraclavicular, or Inguinal Adenopathy.  Assessment:   Annual gynecologic examination 24 y.o.   Contraception: OCP (estrogen/progesterone)   Obesity 1   Problem List Items Addressed This Visit    None    Visit Diagnoses    Encounter for well woman exam with routine gynecological exam    -  Primary   Surveillance for birth control, oral contraceptives          Plan:   Declines flu and COVID vaccines  Pap: Not needed   Labs: Declined   Routine preventative health maintenance measures emphasized: Exercise/Diet/Weight control, Tobacco Warnings, Alcohol/Substance use risks and Stress Management; see AVS  Encouraged to keep blood pressure log, see AVS  Rx Junel for three (3) months pending blood pressure results  Reviewed red flag symptoms and when to call  Return to Clinic - 1 Year for [facility] or sooner if needed   Serafina Royals, CNM  [facility] 06/05/20 9:48 AM

## 2020-08-11 ENCOUNTER — Telehealth: Payer: Self-pay

## 2020-08-11 NOTE — Telephone Encounter (Signed)
Pt called in and stated that she wants to know your input on victoza for weight loss. The pt stated that she has high blood pressure and she has heard that this is fine with that. The pt requested your input and if you don't like victoza for weight loss can you please tell her something you would recommend. The pt would like you to a my chart message back. Please advise

## 2020-08-15 NOTE — Telephone Encounter (Signed)
MyChart message sent to patient. Thanks, JML

## 2020-10-10 ENCOUNTER — Other Ambulatory Visit: Payer: Self-pay | Admitting: Certified Nurse Midwife

## 2020-12-26 ENCOUNTER — Telehealth: Payer: Self-pay | Admitting: Certified Nurse Midwife

## 2020-12-26 MED ORDER — NORETHINDRONE ACET-ETHINYL EST 1-20 MG-MCG PO TABS: 1.0000 | ORAL_TABLET | Freq: Every day | ORAL | 1 refills | Status: AC

## 2020-12-26 NOTE — Telephone Encounter (Signed)
[name] called in and states she needs refills on her birth control JUNEL.  [name] states her last physical was November of 2021 and her next physical is scheduled for November of this year.  She stated she didn't think she had to be seen but wanted to know if this was the case she wasn't given any refills.  She would like them sent to [facility] on [address] In [place].  Please advise.

## 2021-02-05 ENCOUNTER — Other Ambulatory Visit: Payer: Self-pay | Admitting: Certified Nurse Midwife

## 2021-02-05 ENCOUNTER — Ambulatory Visit (INDEPENDENT_AMBULATORY_CARE_PROVIDER_SITE_OTHER): Payer: 59 | Admitting: Certified Nurse Midwife

## 2021-02-05 ENCOUNTER — Other Ambulatory Visit: Payer: Self-pay

## 2021-02-05 VITALS — BP 139/88 | HR 83 | Ht 69.0 in | Wt 250.5 lb

## 2021-02-05 DIAGNOSIS — M5440 Lumbago with sciatica, unspecified side: Secondary | ICD-10-CM

## 2021-02-05 DIAGNOSIS — R5382 Chronic fatigue, unspecified: Secondary | ICD-10-CM

## 2021-02-05 DIAGNOSIS — G8929 Other chronic pain: Secondary | ICD-10-CM

## 2021-02-05 DIAGNOSIS — E669 Obesity, unspecified: Secondary | ICD-10-CM | POA: Diagnosis not present
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
## 2021-02-06 ENCOUNTER — Other Ambulatory Visit: Payer: Self-pay | Admitting: Certified Nurse Midwife

## 2021-02-06 DIAGNOSIS — E669 Obesity, unspecified: Secondary | ICD-10-CM

## 2021-02-07 ENCOUNTER — Other Ambulatory Visit: Payer: Self-pay | Admitting: Certified Nurse Midwife

## 2021-02-07 DIAGNOSIS — M549 Dorsalgia, unspecified: Secondary | ICD-10-CM

## 2021-02-27 ENCOUNTER — Ambulatory Visit: Payer: Self-pay | Admitting: *Deleted

## 2021-02-27 NOTE — Telephone Encounter (Signed)
Reason for Disposition  [1] MODERATE dizziness (e.g., interferes with normal activities) AND [2] has NOT been evaluated by physician for this  (Exception: dizziness caused by heat exposure, sudden standing, or poor fluid intake)  Answer Assessment - Initial Assessment Questions 1. DESCRIPTION: "Describe your dizziness."     Floating 2. LIGHTHEADED: "Do you feel lightheaded?" (e.g., somewhat faint, woozy, weak upon standing)     yes 3. VERTIGO: "Do you feel like either you or the room is spinning or tilting?" (i.e. vertigo)     no 4. SEVERITY: "How bad is it?"  "Do you feel like you are going to faint?" "Can you stand and walk?"   - MILD: Feels slightly dizzy, but walking normally.   - MODERATE: Feels unsteady when walking, but not falling; interferes with normal activities (e.g., school, work).   - SEVERE: Unable to walk without falling, or requires assistance to walk without falling; feels like passing out now.     Mild presently. 5. ONSET:  "When did the dizziness begin?"     yesterday 6. AGGRAVATING FACTORS: "Does anything make it worse?" (e.g., standing, change in head position)     No 7. HEART RATE: "Can you tell me your heart rate?" "How many beats in 15 seconds?"  (Note: not all patients can do this)       No 8. CAUSE: "What do you think is causing the dizziness?"     Unsure 9. RECURRENT SYMPTOM: "Have you had dizziness before?" If Yes, ask: "When was the last time?" "What happened that time?"     No 10. OTHER SYMPTOMS: "Do you have any other symptoms?" (e.g., fever, chest pain, vomiting, diarrhea, bleeding)       Hot, shaky. 11. PREGNANCY: "Is there any chance you are pregnant?" "When was your last menstrual period?"       no  Protocols used: Dizziness - Lightheadedness-A-AH

## 2021-02-27 NOTE — Telephone Encounter (Signed)
Pt reports episode yesterday, "Dizzy, felt like I was going to pass out." Occurred 30 minutes after eating. Reports another episode today, similar but lasted 30 minutes or so; dizzy shaking hot, hands tingling. States occurred 3-4 hours after eating. States presently "Feel foggy" "Not right"  States tingling in arms and hands remain. States has been told she has high blood pressure, has not been checked. Does not have home monitor, can not check BS. Advised UC. States will follow disposition. 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 States "I didn't ask anyone to check me." Again advised driver. Verbalizes understanding.

## 2021-03-07 ENCOUNTER — Encounter: Payer: Self-pay | Admitting: Orthopaedic Surgery

## 2021-03-07 ENCOUNTER — Ambulatory Visit: Payer: 59 | Admitting: Orthopaedic Surgery

## 2021-03-07 ENCOUNTER — Ambulatory Visit (INDEPENDENT_AMBULATORY_CARE_PROVIDER_SITE_OTHER): Payer: 59

## 2021-03-07 ENCOUNTER — Other Ambulatory Visit: Payer: Self-pay

## 2021-03-07 VITALS — Ht 69.0 in | Wt 250.0 lb

## 2021-03-07 DIAGNOSIS — M5442 Lumbago with sciatica, left side: Secondary | ICD-10-CM

## 2021-03-07 DIAGNOSIS — G8929 Other chronic pain: Secondary | ICD-10-CM

## 2021-03-07 DIAGNOSIS — M5441 Lumbago with sciatica, right side: Secondary | ICD-10-CM

## 2021-03-07 NOTE — Progress Notes (Signed)
Office Visit Note   Patient: Shelby Long           Date of Birth: May 27, 1996           MRN: [id number] Visit Date: 03/07/2021              Requested by: Marguarite Arbour, MD [address] [facility] [place],  [place] [address] PCP: Marguarite Arbour, MD   Assessment & Plan: Visit Diagnoses:  1. Chronic bilateral low back pain with bilateral sciatica     Plan: We will make referral for physical therapy in [place] close to where she lives.  She has had pain for 1 year of bilateral back and leg pain and if she does not respond to physical therapy on follow-up we will discuss obtaining a lumbar MRI scan.  Follow-Up Instructions: Return in about 5 weeks (around 04/11/2021).   Orders:  Orders Placed This Encounter  Procedures   XR Lumbar Spine 2-3 Views   Ambulatory referral to Physical Therapy   No orders of the defined types were placed in this encounter.     Procedures: No procedures performed   Clinical Data: No additional findings.   Subjective: Chief Complaint  Patient presents with   Lower Back - Pain    HPI 25 year old female seen with new patient visit with back pain.  States she has had pain for 1 year no known injury.  She does have leg pain worse on left than right she denies numbness or tingling.  She had been through massage therapy and also seen a chiropractor.  She not had any formal physical therapy no history of epidurals.  She has tried Tylenol also muscle relaxants.  Increased pain with activities with repetitive movement.  No bowel bladder associated symptoms.  She does have history of endometriosis.  Review of Systems no chills or fever.  All other systems noncontributory.   Objective: Vital Signs: Ht 5\' 9"  (1.753 m)   Wt 250 lb (113.4 kg)   BMI 36.92 kg/m   Physical Exam Constitutional:      Appearance: She is well-developed.  HENT:     Head: Normocephalic.     Right Ear: External ear normal.     Left Ear:  External ear normal. There is no impacted cerumen.  Eyes:     Pupils: Pupils are equal, round, and reactive to light.  Neck:     Thyroid: No thyromegaly.     Trachea: No tracheal deviation.  Cardiovascular:     Rate and Rhythm: Normal rate.  Pulmonary:     Effort: Pulmonary effort is normal.  Abdominal:     Palpations: Abdomen is soft.  Musculoskeletal:     Cervical back: No rigidity.  Skin:    General: Skin is warm and dry.  Neurological:     Mental Status: She is alert and oriented to person, place, and time.  Psychiatric:        Behavior: Behavior normal.    Ortho Exam patient is amatory heel toe gait.  No antalgic gait.  She gets from sitting to standing normally. Normal ankle dorsiflexion.   Specialty Comments:  No specialty comments available.  Imaging: AP lateral lumbar spine x-rays are obtained reviewed.  This shows no spondylolisthesis normal lumbar curvature.  Sacroiliac joints are normal.  Negative for acute changes.  Compression: Lumbar radiographs negative for acute changes.   PMFS History: Patient Active Problem List   Diagnosis Date Noted   Bilateral hip pain  03/04/2019   Dyspareunia in female 03/04/2019   Low back pain 03/04/2019   Anxiety 06/02/2016   History of depression 06/02/2016   Past Medical History:  Diagnosis Date   PONV (postoperative nausea and vomiting)    Preeclampsia    Resolved after birth of her daughter     Family History  Problem Relation Age of Onset   Sjogren's syndrome Mother    Breast cancer Maternal Grandmother    Parkinson's disease Maternal Grandfather    Ovarian cancer Neg Hx    Colon cancer Neg Hx     Past Surgical History:  Procedure Laterality Date   CESAREAN SECTION     CYST EXCISION N/A    on tailbone   ESOPHAGOGASTRODUODENOSCOPY (EGD) WITH PROPOFOL N/A 03/17/2017   Procedure: ESOPHAGOGASTRODUODENOSCOPY (EGD) WITH PROPOFOL;  Surgeon: Christena Deem, MD;  Location: [facility] ENDOSCOPY;  Service: Endoscopy;   Laterality: N/A;   WISDOM TOOTH EXTRACTION  2015   Social History   Occupational History   Not on file  Tobacco Use   Smoking status: Never   Smokeless tobacco: Never  Vaping Use   Vaping Use: Every day  Substance and Sexual Activity   Alcohol use: Yes    Comment: occasional   Drug use: No   Sexual activity: Yes    Birth control/protection: Condom, Pill

## 2021-03-29 ENCOUNTER — Ambulatory Visit (INDEPENDENT_AMBULATORY_CARE_PROVIDER_SITE_OTHER): Payer: Self-pay | Admitting: Nurse Practitioner

## 2021-03-29 ENCOUNTER — Encounter: Payer: Self-pay | Admitting: Nurse Practitioner

## 2021-03-29 ENCOUNTER — Encounter: Payer: Self-pay | Admitting: Orthopaedic Surgery

## 2021-03-29 ENCOUNTER — Other Ambulatory Visit: Payer: Self-pay

## 2021-03-29 VITALS — BP 110/74 | HR 93 | Temp 98.7°F | Ht 67.0 in | Wt 246.6 lb

## 2021-03-29 DIAGNOSIS — R030 Elevated blood-pressure reading, without diagnosis of hypertension: Secondary | ICD-10-CM | POA: Insufficient documentation

## 2021-03-29 DIAGNOSIS — Z7689 Persons encountering health services in other specified circumstances: Secondary | ICD-10-CM

## 2021-03-29 DIAGNOSIS — Z114 Encounter for screening for human immunodeficiency virus [HIV]: Secondary | ICD-10-CM

## 2021-03-29 DIAGNOSIS — F321 Major depressive disorder, single episode, moderate: Secondary | ICD-10-CM

## 2021-03-29 DIAGNOSIS — R002 Palpitations: Secondary | ICD-10-CM

## 2021-03-29 DIAGNOSIS — F4312 Post-traumatic stress disorder, chronic: Secondary | ICD-10-CM

## 2021-03-29 DIAGNOSIS — Z1159 Encounter for screening for other viral diseases: Secondary | ICD-10-CM
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
## 2021-03-29 NOTE — Assessment & Plan Note (Signed)
Ongoing with no red flag symptoms, suspect more PVC or PAC in nature.  Will obtain Holter monitor testing.  Check CBC, CMP, TSH, and lipid panel today.  EKG reassuring on exam today and reviewed with patient.  Could consider PRN Propranolol if PVC or PAC in nature, this may also benefit anxiety.  Return in 4 weeks.

## 2021-03-29 NOTE — Patient Instructions (Signed)
Palpitations Palpitations are feelings that your heartbeat is not normal. Your heartbeat may feel like it is: Uneven. Faster than normal. Fluttering. Skipping a beat. This is usually not a serious problem. In some cases, you may need tests to rule out any serious problems. Follow these instructions at home: Pay attention to any changes in your condition. Take these actions to help manage your symptoms: Eating and drinking Avoid: Coffee, tea, soft drinks, and energy drinks. Chocolate. Alcohol. Diet pills. Lifestyle  Try to lower your stress. These things can help you relax: Yoga. Deep breathing and meditation. Exercise. Using words and images to create positive thoughts (guided imagery). Using your mind to control things in your body (biofeedback). Do not use drugs. Get plenty of rest and sleep. Keep a regular bed time. General instructions  Take over-the-counter and prescription medicines only as told by your doctor. Do not use any products that contain nicotine or tobacco, such as cigarettes and e-cigarettes. If you need help quitting, ask your doctor. Keep all follow-up visits as told by your doctor. This is important. You may need more tests if palpitations do not go away or get worse. Contact a doctor if: Your symptoms last more than 24 hours. Your symptoms occur more often. Get help right away if you: Have chest pain. Feel short of breath. Have a very bad headache. Feel dizzy. Pass out (faint). Summary Palpitations are feelings that your heartbeat is uneven or faster than normal. It may feel like your heart is fluttering or skipping a beat. Avoid food and drinks that may cause palpitations. These include caffeine, chocolate, and alcohol. Try to lower your stress. Do not smoke or use drugs. Get help right away if you faint or have chest pain, shortness of breath, a severe headache, or dizziness. This information is not intended to replace advice given to you by your  health care provider. Make sure you discuss any questions you have with your health care provider. Document Revised: 11/10/2020 Document Reviewed: 08/27/2017 Elsevier Patient Education  2022 Elsevier Inc.  

## 2021-03-29 NOTE — Assessment & Plan Note (Addendum)
Chronic, ongoing with history of trauma in teen years and significant family history anxiety and depression.  Denies SI/HI today, does report occasional fleeting thoughts but no plan.  Has safety plan and network in place.  PHQ 9 = 24 today. Tried medication in past, she is not sure what -- both parents on medication and is unsure what, but will find out.  Is leery about medication and does not wish to start today.  Discussed possible benefit of trial Celexa or Paxil due to anticholinergic affect which could offer benefit to depression and anxiety.  She will think about these.  Could even consider PRN Propranolol for both palpitations and mood.  Recommend return to therapy.  Return in 4 weeks for follow-up.

## 2021-03-29 NOTE — Assessment & Plan Note (Signed)
BP below goal in office today, she reports some elevations with symptoms at home.  Recommend for next 4 weeks she check BP every morning and evening, document these.  Avoid high salt meals and NSAIDS at home.  Recommend focus on healthy diet, DASH, and good water intake at home.  Bring log to visit in next 4 weeks.  Could consider low dose medication dependent on readings.  Check CBC, CMP, TSH today + lipid.

## 2021-03-29 NOTE — Assessment & Plan Note (Signed)
Refer to depression plan for further.  PHQ 9 = 24 and GAD 7 = 20 today. Denies SI/HI today.  Refuses medication.

## 2021-03-30 LAB — CBC WITH DIFFERENTIAL/PLATELET
Basophils Absolute: 0 10*3/uL (ref 0.0–0.2)
Basos: 1 %
EOS (ABSOLUTE): 0.1 10*3/uL (ref 0.0–0.4)
Eos: 2 %
Hematocrit: 41.8 % (ref 34.0–46.6)
Hemoglobin: 13.7 g/dL (ref 11.1–15.9)
Immature Grans (Abs): 0 10*3/uL (ref 0.0–0.1)
Immature Granulocytes: 0 %
Lymphocytes Absolute: 1.5 10*3/uL (ref 0.7–3.1)
Lymphs: 36 %
MCH: 30 pg (ref 26.6–33.0)
MCHC: 32.8 g/dL (ref 31.5–35.7)
MCV: 92 fL (ref 79–97)
Monocytes Absolute: 0.2 10*3/uL (ref 0.1–0.9)
Monocytes: 6 %
Neutrophils Absolute: 2.3 10*3/uL (ref 1.4–7.0)
Neutrophils: 55 %
Platelets: 239 10*3/uL (ref 150–450)
RBC: 4.57 x10E6/uL (ref 3.77–5.28)
RDW: 12.6 % (ref 11.7–15.4)
WBC: 4.2 10*3/uL (ref 3.4–10.8)

## 2021-03-30 LAB — HEPATITIS C ANTIBODY: Hep C Virus Ab: <0.1 s/co ratio (ref 0.0–0.9)

## 2021-03-30 LAB — COMPREHENSIVE METABOLIC PANEL
ALT: 24 IU/L (ref 0–32)
AST: 16 IU/L (ref 0–40)
Albumin/Globulin Ratio: 1.7 (ref 1.2–2.2)
Albumin: 4.5 g/dL (ref 3.9–5.0)
Alkaline Phosphatase: 68 IU/L (ref 44–121)
BUN/Creatinine Ratio: 11 (ref 9–23)
BUN: 11 mg/dL (ref 6–20)
Bilirubin Total: 0.5 mg/dL (ref 0.0–1.2)
CO2: 23 mmol/L (ref 20–29)
Calcium: 9.6 mg/dL (ref 8.7–10.2)
Chloride: 102 mmol/L (ref 96–106)
Creatinine, Ser: 0.98 mg/dL (ref 0.57–1.00)
Globulin, Total: 2.7 g/dL (ref 1.5–4.5)
Glucose: 73 mg/dL (ref 65–99)
Potassium: 4.3 mmol/L (ref 3.5–5.2)
Sodium: 140 mmol/L (ref 134–144)
Total Protein: 7.2 g/dL (ref 6.0–8.5)
eGFR: 82 mL/min/{1.73_m2} (ref >59)

## 2021-03-30 LAB — LIPID PANEL W/O CHOL/HDL RATIO
Cholesterol, Total: 176 mg/dL (ref 100–199)
HDL: 51 mg/dL (ref >39)
LDL Chol Calc (NIH): 112 mg/dL — ABNORMAL HIGH (ref 0–99)
Triglycerides: 66 mg/dL (ref 0–149)
VLDL Cholesterol Cal: 13 mg/dL (ref 5–40)

## 2021-03-30 LAB — TSH: TSH: 2.06 u[IU]/mL (ref 0.450–4.500)

## 2021-03-30 LAB — HIV ANTIBODY (ROUTINE TESTING W REFLEX): HIV Screen 4th Generation wRfx: NONREACTIVE

## 2021-03-30 NOTE — Progress Notes (Signed)
Contacted via MyChart     Good afternoon Shelby Long, your labs have returned and overall look great with one exception: -  Your LDL is above normal. The LDL is the bad cholesterol. Over time and in combination with inflammation and other factors, this contributes to plaque which in turn may lead to stroke and/or heart attack down the road. Sometimes high LDL is primarily genetic, and people might be eating all the right foods but still have high numbers. Other times, there is room for improvement in one's diet and eating healthier can bring this number down and potentially reduce one's risk of heart attack and/or stroke.  To reduce your LDL, Remember - more fruits and vegetables, more fish, and limit red meat and dairy products. More soy, nuts, beans, barley, lentils, oats and plant sterol ester enriched margarine instead of butter. I also encourage eliminating sugar and processed food. Remember, shop on the outside of the grocery store and visit your [employer]. If you would like to talk with me about dietary changes for your cholesterol, please let me know. We should recheck your cholesterol in 12 months. Any questions on these labs? Keep being awesome!!  Thank you for allowing me to participate in your care.  I appreciate you. Kindest regards, Auriana Scalia

## 2021-04-04 NOTE — Addendum Note (Signed)
Addended by: Aura Dials T on: 04/04/2021 06:20 PM   Modules accepted: Orders

## 2021-04-11 ENCOUNTER — Ambulatory Visit: Payer: 59 | Admitting: Orthopaedic Surgery

## 2021-04-26 ENCOUNTER — Encounter: Payer: Self-pay | Admitting: Nurse Practitioner

## 2021-04-26 DIAGNOSIS — E7800 Pure hypercholesterolemia, unspecified: Secondary | ICD-10-CM | POA: Insufficient documentation

## 2021-04-30 ENCOUNTER — Ambulatory Visit: Payer: Self-pay | Admitting: Nurse Practitioner

## 2021-05-15 ENCOUNTER — Ambulatory Visit: Payer: Self-pay | Admitting: Nurse Practitioner

## 2021-05-23 ENCOUNTER — Other Ambulatory Visit: Payer: Self-pay

## 2021-05-23 ENCOUNTER — Telehealth: Payer: Self-pay | Admitting: Certified Nurse Midwife

## 2021-05-23 MED ORDER — NORETHIN ACE-ETH ESTRAD-FE 1-20 MG-MCG PO TABS: 1.0000 | ORAL_TABLET | Freq: Every day | ORAL | 0 refills | Status: DC

## 2021-05-23 NOTE — Telephone Encounter (Signed)
Pt called asking for a refill on her birth control (Loestrin) she confirmed her Pharmacy:[facility] [address]. Please Advise.

## 2021-05-23 NOTE — Telephone Encounter (Signed)
Refill sent.

## 2021-06-01 ENCOUNTER — Ambulatory Visit: Payer: 59 | Admitting: Cardiology

## 2021-06-04 ENCOUNTER — Encounter: Payer: Self-pay | Admitting: Cardiology

## 2021-06-07 ENCOUNTER — Encounter: Payer: 59 | Admitting: Certified Nurse Midwife

## 2021-06-14 ENCOUNTER — Telehealth: Payer: Self-pay | Admitting: Certified Nurse Midwife

## 2021-06-14 NOTE — Telephone Encounter (Signed)
Pt called asking if she could be worked in- she found a lump in right armpit 1 week ago, it is now painful and going down into right breasts. Please Advise.

## 2021-06-19 ENCOUNTER — Encounter: Payer: 59 | Admitting: Certified Nurse Midwife

## 2021-06-28 ENCOUNTER — Encounter: Payer: Self-pay | Admitting: Certified Nurse Midwife

## 2021-08-06 ENCOUNTER — Ambulatory Visit: Payer: 59 | Admitting: Certified Nurse Midwife

## 2021-08-06 ENCOUNTER — Other Ambulatory Visit: Payer: Self-pay

## 2021-08-06 VITALS — Ht 67.0 in | Wt 224.0 lb

## 2021-08-06 DIAGNOSIS — N6312 Unspecified lump in the right breast, upper inner quadrant: Secondary | ICD-10-CM | POA: Insufficient documentation

## 2021-08-06 DIAGNOSIS — N644 Mastodynia: Secondary | ICD-10-CM | POA: Diagnosis not present

## 2021-08-06 NOTE — Progress Notes (Signed)
GYN ENCOUNTER NOTE  Subjective:       Shelby Long is a 26 y.o. G90P0101 female is here for gynecologic evaluation of the following issues:  1. Breast pain and lump in right breast x 2 months. Pt states she has significant family history of breast cancer on her maternal side , her mother and grandmother. State it has gotten larger in size over the past few months and is tender on palpation.    Gynecologic History No LMP recorded (lmp unknown). (Menstrual status: Oral contraceptives). Contraception: OCP (estrogen/progesterone) Last Pap: 03/04/2019. Results were: normal Last mammogram: n/a.   Obstetric History OB History  Gravida Para Term Preterm AB Living  1 1 0 1 0 1  SAB IAB Ectopic Multiple Live Births  0 0 0   1    # Outcome Date GA Lbr Len/2nd Weight Sex Delivery Anes PTL Lv  1 Preterm 06/10/16   3 lb 11 oz (1.673 kg) F CS-LTranv  N LIV     Complications: Failure to Progress in First Stage, Severe preeclampsia    Past Medical History:  Diagnosis Date   Anxiety    Depression    PONV (postoperative nausea and vomiting)    Preeclampsia    Resolved after birth of her daughter    PTSD (post-traumatic stress disorder)     Past Surgical History:  Procedure Laterality Date   CESAREAN SECTION     CYST EXCISION N/A    on tailbone   ESOPHAGOGASTRODUODENOSCOPY (EGD) WITH PROPOFOL N/A 03/17/2017   Procedure: ESOPHAGOGASTRODUODENOSCOPY (EGD) WITH PROPOFOL;  Surgeon: Lollie Sails, MD;  Location: [facility] ENDOSCOPY;  Service: Endoscopy;  Laterality: N/A;   WISDOM TOOTH EXTRACTION  2015   WISDOM TOOTH EXTRACTION      Current Outpatient Medications on File Prior to Visit  Medication Sig Dispense Refill   EPINEPHrine 0.3 mg/0.3 mL IJ SOAJ injection Inject 0.3 mLs (0.3 mg total) into the muscle as needed for anaphylaxis. 1 Device 1   norethindrone-ethinyl estradiol (JUNEL 1/20) 1-20 MG-MCG tablet Take 1 tablet by mouth daily. 84 tablet 1   norethindrone-ethinyl estradiol-FE  (LOESTRIN FE) 1-20 MG-MCG tablet Take 1 tablet by mouth daily. 28 tablet 0   ibuprofen (ADVIL,MOTRIN) 800 MG tablet Take 1 tablet (800 mg total) by mouth every 8 (eight) hours as needed. (Patient not taking: Reported on 02/05/2021) 60 tablet 1   No current facility-administered medications on file prior to visit.    Allergies  Allergen Reactions   Levaquin [Levofloxacin] Anaphylaxis and Swelling   Other Anaphylaxis    PEANUTS   Shellfish-Derived Products Anaphylaxis   Sulfa Antibiotics Anaphylaxis and Swelling   Clindamycin/Lincomycin Swelling   Vancomycin Swelling   Amoxicillin    Clindamycin    Penicillins    Sulfa Antibiotics    Penicillins Swelling and Rash    Social History   Socioeconomic History   Marital status: Significant Other    Spouse name: Not on file   Number of children: Not on file   Years of education: Not on file   Highest education level: Not on file  Occupational History   Not on file  Tobacco Use   Smoking status: Never   Smokeless tobacco: Never  Vaping Use   Vaping Use: Every day  Substance and Sexual Activity   Alcohol use: Yes    Comment: socially   Drug use: Not Currently   Sexual activity: Yes    Birth control/protection: Condom, Pill  Other Topics Concern   Not on  file  Social History Narrative   ** Merged History Encounter **       Social Determinants of Health   Financial Resource Strain: Low Risk    Difficulty of Paying Living Expenses: Not hard at all  Food Insecurity: No Food Insecurity   Worried About Charity fundraiser in the Last Year: Never true   Arboriculturist in the Last Year: Never true  Transportation Needs: No Transportation Needs   Lack of Transportation (Medical): No   Lack of Transportation (Non-Medical): No  Physical Activity: Insufficiently Active   Days of Exercise per Week: 2 days   Minutes of Exercise per Session: 40 min  Stress: No Stress Concern Present   Feeling of Stress : Only a little  Social  Connections: Moderately Isolated   Frequency of Communication with Friends and Family: More than three times a week   Frequency of Social Gatherings with Friends and Family: More than three times a week   Attends Religious Services: Never   Marine scientist or Organizations: No   Attends Music therapist: Never   Marital Status: Married  Human resources officer Violence: Not At Risk   Fear of Current or Ex-Partner: No   Emotionally Abused: No   Physically Abused: No   Sexually Abused: No    Family History  Problem Relation Age of Onset   Anxiety disorder Mother    Fibromyalgia Mother    Sjogren's syndrome Mother    Anxiety disorder Father    Hypertension Father    Healthy Sister    Healthy Brother    Healthy Daughter    Dementia Paternal Grandmother    Alzheimer's disease Paternal Grandfather    Breast cancer Maternal Grandmother    Parkinson's disease Maternal Grandfather    Ovarian cancer Neg Hx    Colon cancer Neg Hx     The following portions of the patient's history were reviewed and updated as appropriate: allergies, current medications, past family history, past medical history, past social history, past surgical history and problem list.  Review of Systems Review of Systems - Negative except as mentioned in HPI Review of Systems - General ROS: negative for - chills, fatigue, fever, hot flashes, malaise or night sweats Hematological and Lymphatic ROS: negative for - bleeding problems or swollen lymph nodes Gastrointestinal ROS: negative for - abdominal pain, blood in stools, change in bowel habits and nausea/vomiting Musculoskeletal ROS: negative for - joint pain, muscle pain or muscular weakness Genito-Urinary ROS: negative for - change in menstrual cycle, dysmenorrhea, dyspareunia, dysuria, genital discharge, genital ulcers, hematuria, incontinence, irregular/heavy menses, nocturia or pelvic pain  Objective:   Ht 5\' 7"  (1.702 m)    Wt 224 lb (101.6 kg)     LMP  (LMP Unknown)    BMI 35.08 kg/m  CONSTITUTIONAL: Well-developed, well-nourished female in no acute distress.  HENT:  Normocephalic, atraumatic.  NECK: Normal range of motion, supple, no masses.  Normal thyroid.  SKIN: Skin is warm and dry. No rash noted. Not diaphoretic. No erythema. No pallor. [place]: Alert and oriented to person, place, and time. PSYCHIATRIC: Normal mood and affect. Normal behavior. Normal judgment and thought content. CARDIOVASCULAR:Not Examined RESPIRATORY: Not Examined BREASTS: Breasts: right breast normal in appearance fibrocystic changed noted in upper outer quadrant.,no skin or nipple changes or axillary nodes, Area of concern is at 1 o clock approximately 3 cm from nipple. Feels firm, flat , tender to palpation ( per pt) a 2 cm in size,  non mobile.  left breast normal without mass, skin or nipple changes or axillary nodes. ABDOMEN: Soft, non distended; Non tender.  No Organomegaly. PELVIC: not indicated MUSCULOSKELETAL: Normal range of motion. No tenderness.  No cyanosis, clubbing, or edema.     Assessment:   1. Breast lump on right side at 1 o'clock position  - MM Digital Diagnostic Unilat R; Future  2. Breast pain, right  - MM Digital Diagnostic Unilat R; Future     Plan:   Suspect fibrocystic tissue, but given family history pt requesting mammogram to evaluate further . Orders placed. Self help measures reviewed. Follow up prn.   Philip Aspen, CNM

## 2021-08-06 NOTE — Patient Instructions (Signed)
Breast Cyst  A breast cyst is a sac in the breast that is filled with fluid. They are usually noncancerous (benign) and are common among women. Breast cysts are most often in the upper, outer portion of the breast. One or more cysts may develop. They form when fluidbuilds up inside the breast glands. There are several types of breast cysts. Some are too small to feel, but these can be seen with imaging tests such as an X-ray of the breast (mammogram) or ultrasound. Breast cysts do not increase your risk of breast cancer. They usually disappear after you no longer have a menstrual cycle (after menopause), unless you take artificial hormones (are on hormone therapy). What are the causes? This condition may be caused by: Blockage of tubes (ducts) in the breast glands, which leads to fluid buildup. Duct blockage may result from: Fibrocystic breast changes. This is a common, benign condition that occurs when women go through hormonal changes during the menstrual cycle. This is a common cause of multiple breast cysts. Overgrowth of breast tissue or breast glands. Scar tissue in the breast from previous surgery. Changes in certain female hormones (estrogen and progesterone). The exact cause of this condition is not known. What increases the risk? You may be more likely to develop breast cysts if you have not gone throughmenopause. What are the signs or symptoms? Symptoms of this condition include: Feeling one or more smooth, round, soft lumps (like grapes) in the breast that are easily movable. The lump or lumps may get bigger and more painful before your menstrual period and get smaller after your menstrual period. Breast discomfort or pain. How is this diagnosed? This condition may be diagnosed based on: A physical exam. A cyst can be felt by your health care provider during this exam. Imaging tests, such as mammogram or ultrasound. Fluid may be removed from the cyst with a needle (fine-needle  aspiration) and tested to make sure the cyst is not cancerous. How is this treated? Treatment may not be needed for this condition. Your health care provider may monitor the cyst to see if it goes away on its own. If the cyst is uncomfortable or gets bigger, or if you do not like how the cyst makes your breast look, you may need treatment. Treatment may include: Hormone therapy. Fine-needle aspiration to drain fluid from the cyst. There is a chance of the cyst coming back (recurring) after aspiration. Surgery to remove the cyst. Follow these instructions at home: Self-exams  Do a breast self-exam every month, or as often as directed. A breast self-exam involves: Comparing your breasts in the mirror. Looking for visible changes in your skin or nipples. Feeling for lumps or changes. Having many breast cysts may make it harder to feel for new lumps. Understand how your breasts normally look and feel, and write down any changes in your breasts. Tell your health care provider about any changes.  Eating and drinking Follow instructions from your health care provider about eating and drinking restrictions. Drink enough fluid to keep your urine pale yellow. Avoid caffeine. Cut down on salt (sodium) in what you eat and drink, especially before your menstrual period. Too much sodium can cause fluid buildup, breast swelling, and discomfort. General instructions See your health care provider regularly. Get a yearly physical exam. If you are 20-40 years old, get a clinical breast exam every 1-3 years. After the age of 40 years, get this exam every year. Get mammograms as often as directed. Take over-the-counter   and prescription medicines only as told by your health care provider. Wear a supportive bra, especially when exercising. Keep all follow-up visits as told by your health care provider. This is important. Contact a health care provider if: You feel, or think you feel, a lump in your  breast. You notice that both breasts look or feel different than usual. Your breast is still causing pain after your menstrual period is over. You find new lumps or bumps that were not there before. You feel lumps in your armpit. Get help right away if: You have severe pain, tenderness, redness, or warmth in your breast. You have fluid or blood leaking from your nipple. Your breast lump becomes hard and painful. You notice dimpling or wrinkling of the breast or nipple. Summary A breast cyst is a sac in the breast that is filled with fluid. Treatment may not be needed for this condition. If the cyst is uncomfortable or gets bigger, or if you do not like how the cyst makes your breast look, you may need treatment. This information is not intended to replace advice given to you by your health care provider. Make sure you discuss any questions you have with your healthcare provider. Document Revised: 12/01/2018 Document Reviewed: 12/01/2018 Elsevier Patient Education  2022 Elsevier Inc.  

## 2021-08-07 ENCOUNTER — Other Ambulatory Visit: Payer: Self-pay | Admitting: Certified Nurse Midwife

## 2021-08-07 DIAGNOSIS — N644 Mastodynia: Secondary | ICD-10-CM

## 2021-08-07 DIAGNOSIS — N6312 Unspecified lump in the right breast, upper inner quadrant: Secondary | ICD-10-CM

## 2021-08-14 ENCOUNTER — Encounter: Payer: Self-pay | Admitting: Certified Nurse Midwife

## 2021-08-15 ENCOUNTER — Telehealth: Payer: Self-pay | Admitting: Certified Nurse Midwife

## 2021-08-15 ENCOUNTER — Ambulatory Visit
Admission: RE | Admit: 2021-08-15 | Discharge: 2021-08-15 | Disposition: A | Discharge status: Home / Self Care | Payer: 59 | Source: Ambulatory Visit | Attending: Certified Nurse Midwife | Admitting: Certified Nurse Midwife

## 2021-08-15 ENCOUNTER — Other Ambulatory Visit: Payer: Self-pay

## 2021-08-15 DIAGNOSIS — N644 Mastodynia: Secondary | ICD-10-CM | POA: Diagnosis present

## 2021-08-15 DIAGNOSIS — N6312 Unspecified lump in the right breast, upper inner quadrant: Secondary | ICD-10-CM | POA: Insufficient documentation

## 2021-08-15 MED ORDER — NORETHIN ACE-ETH ESTRAD-FE 1-20 MG-MCG PO TABS: 1.0000 | ORAL_TABLET | Freq: Every day | ORAL | 0 refills | Status: AC

## 2021-08-15 NOTE — Telephone Encounter (Signed)
Refill sent.

## 2021-08-15 NOTE — Telephone Encounter (Signed)
Pt has called to r/s her annual physical with Deneise Lever and would like to see if her Birth Control medication can be called in. Pt stated that she was out of the medication.

## 2021-10-31 ENCOUNTER — Encounter: Payer: 59 | Admitting: Certified Nurse Midwife

## 2022-02-11 ENCOUNTER — Encounter: Payer: Self-pay | Admitting: Certified Nurse Midwife

## 2022-02-12 ENCOUNTER — Other Ambulatory Visit: Payer: Self-pay | Admitting: Certified Nurse Midwife

## 2022-02-12 DIAGNOSIS — Z8759 Personal history of other complications of pregnancy, childbirth and the puerperium: Secondary | ICD-10-CM

## 2022-06-05 IMAGING — US US BREAST*R* LIMITED INC AXILLA
1 series · 2 of 2 positions shown · non-contrast
Comparison: None.

CLINICAL DATA: 25-year-old female with a palpable right breast
lump. Family history of breast cancer in grandmother and great aunt.

EXAM:
ULTRASOUND OF THE RIGHT BREAST

[Series 1: us breast*right* limited inc axilla · 0.07mm/px · 2 of 2 slices shown]
[im 1/2]
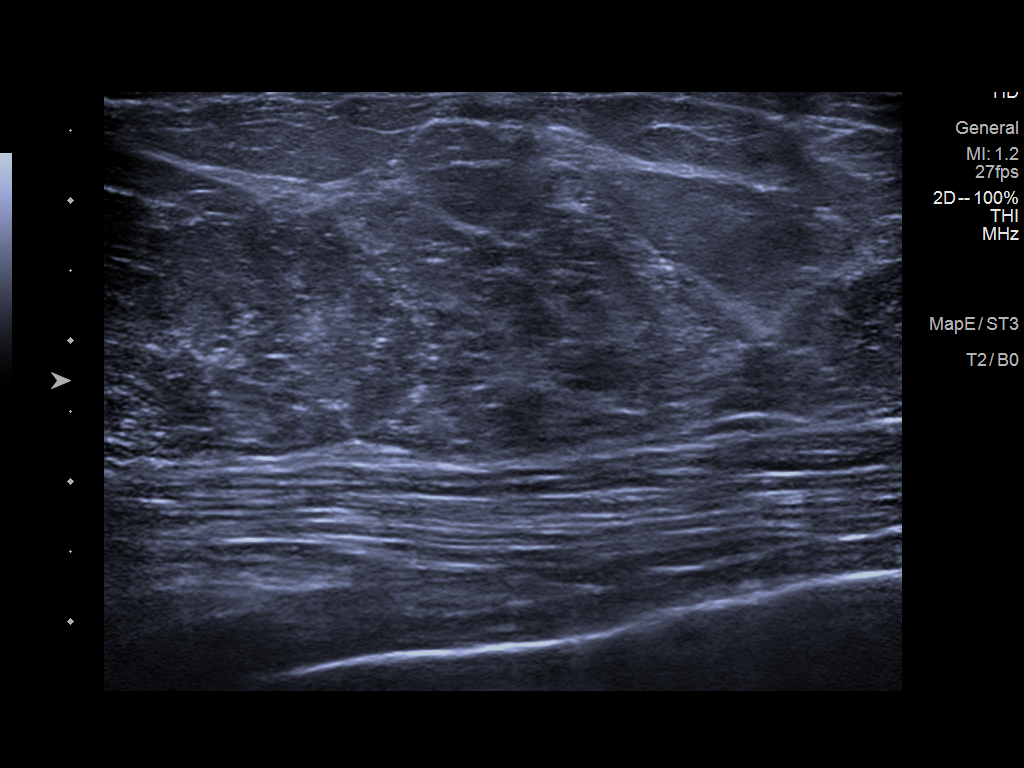
[im 2/2]
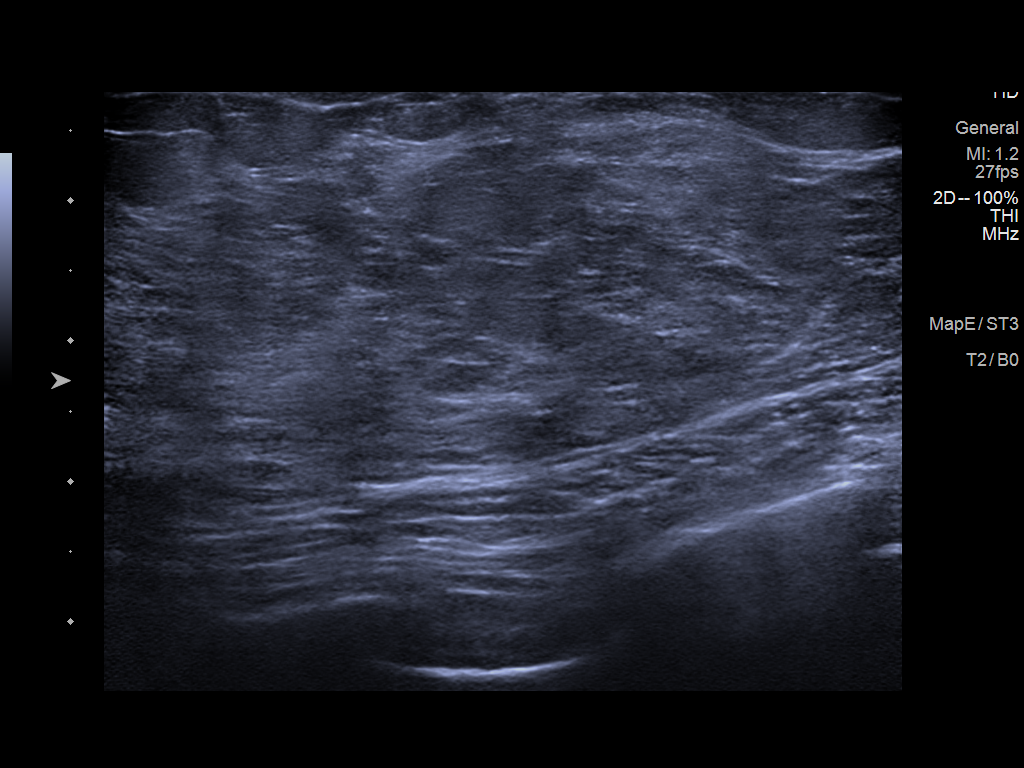

[2 of 2 positions shown; findings below may reference images not displayed]

FINDINGS: Targeted ultrasound is performed, showing heterogenous
fibroglandular tissue without focal or suspicious sonographic
abnormality. Evaluation of the upper inner quadrant of the right
breast was performed.
IMPRESSION: Unremarkable ultrasound evaluation of the right breast.

RECOMMENDATION:
1. Clinical follow-up recommended for the palpable area of concern
in the right breast. Any further workup should be based on clinical
grounds.
2. Screening mammogram at age 40 unless there are persistent or
intervening clinical concerns. (Code:[id number]-G-5ML)

I have discussed the findings and recommendations with the patient.
If applicable, a reminder letter will be sent to the patient
regarding the next appointment.

BI-RADS CATEGORY  1: Negative.

## 2022-08-21 ENCOUNTER — Telehealth: Payer: Self-pay

## 2022-12-08 ENCOUNTER — Emergency Department
Admission: EM | Admit: 2022-12-08 | Discharge: 2022-12-08 | Disposition: A | Discharge status: Home / Self Care | Payer: 59 | Attending: Emergency Medicine | Admitting: Emergency Medicine

## 2022-12-08 ENCOUNTER — Encounter: Payer: Self-pay | Admitting: Emergency Medicine

## 2022-12-08 ENCOUNTER — Other Ambulatory Visit: Payer: Self-pay

## 2022-12-08 DIAGNOSIS — Y9389 Activity, other specified: Secondary | ICD-10-CM | POA: Diagnosis not present

## 2022-12-08 DIAGNOSIS — W269XXA Contact with unspecified sharp object(s), initial encounter: Secondary | ICD-10-CM | POA: Diagnosis not present

## 2022-12-08 DIAGNOSIS — S61011A Laceration without foreign body of right thumb without damage to nail, initial encounter: Secondary | ICD-10-CM | POA: Insufficient documentation

## 2022-12-08 NOTE — ED Provider Notes (Signed)
[facility] Provider Note  Patient Contact: 6:25 PM (approximate)   History   Laceration   HPI  Shelby Long is a 27 y.o. female presents to the emergency department with a linear, right thumb laceration.  Laceration is approximately 1 cm.  No numbness or tingling of the right hand.  Tetanus status is up-to-date.  Laceration was sustained while cutting bread.      Physical Exam   Triage Vital Signs: ED Triage Vitals  Enc Vitals Group     BP 12/08/22 1632 (!) 135/97     Pulse Rate 12/08/22 1632 78     Resp 12/08/22 1632 17     Temp 12/08/22 1632 98.4 F (36.9 C)     Temp Source 12/08/22 1632 Oral     SpO2 12/08/22 1632 100 %     Weight 12/08/22 1641 215 lb (97.5 kg)     Height 12/08/22 1641 5\' 7"  (1.702 m)     Head Circumference --      Peak Flow --      Pain Score 12/08/22 1641 7     Pain Loc --      Pain Edu? --      Excl. in GC? --     Most recent vital signs: Vitals:   12/08/22 1632  BP: (!) 135/97  Pulse: 78  Resp: 17  Temp: 98.4 F (36.9 C)  SpO2: 100%     General: Alert and in no acute distress. Eyes:  PERRL. EOMI. Head: No acute traumatic findings ENT:      Nose: No congestion/rhinnorhea.      Mouth/Throat: Mucous membranes are moist.  Neck: No stridor. No cervical spine tenderness to palpation. Cardiovascular:  Good peripheral perfusion Respiratory: Normal respiratory effort without tachypnea or retractions. Lungs CTAB. Good air entry to the bases with no decreased or absent breath sounds. Gastrointestinal: Bowel sounds 4 quadrants. Soft and nontender to palpation. No guarding or rigidity. No palpable masses. No distention. No CVA tenderness. Musculoskeletal: Full range of motion to all extremities.  Neurologic:  No gross focal neurologic deficits are appreciated.  Skin: Patient has a 0.5 cm laceration of right thumb.    ED Results / Procedures / Treatments   Labs (all labs ordered are listed, but only  abnormal results are displayed) Labs Reviewed - No data to display    PROCEDURES:  Critical Care performed: No  ..Laceration Repair  Date/Time: 12/08/2022 6:26 PM  Performed by: Orvil Feil, PA-C Authorized by: Orvil Feil, PA-C   Consent:    Consent obtained:  Verbal   Risks discussed:  Infection and pain Universal protocol:    Procedure explained and questions answered to patient or proxy's satisfaction: yes     Patient identity confirmed:  Verbally with patient Anesthesia:    Anesthesia method:  None Laceration details:    Location:  Finger   Finger location:  R thumb   Length (cm):  1   Depth (mm):  3 Exploration:    Limited defect created (wound extended): no     Contaminated: no   Treatment:    Area cleansed with:  Povidone-iodine   Amount of cleaning:  Standard   Irrigation solution:  Sterile saline   Debridement:  None Skin repair:    Repair method:  Tissue adhesive Approximation:    Approximation:  Close Repair type:    Repair type:  Simple Post-procedure details:    Dressing:  Adhesive bandage    MEDICATIONS  ORDERED IN ED: Medications - No data to display   IMPRESSION / MDM / ASSESSMENT AND PLAN / ED COURSE  I reviewed the triage vital signs and the nursing notes.                              Assessment and plan Laceration 27 year old female presents to the emergency department with a 1 cm laceration of right thumb.  Vital signs are reassuring at triage.  On exam, patient was alert and nontoxic-appearing with no neurodeficits noted.  Laceration was well-approximated.  Dermabond was applied and patient education regarding wound care was given.  Return precautions were given to return with new or worsening symptoms.     FINAL CLINICAL IMPRESSION(S) / ED DIAGNOSES   Final diagnoses:  Laceration of right thumb without foreign body without damage to nail, initial encounter     Rx / DC Orders   ED Discharge Orders     None         Note:  This document was prepared using Dragon voice recognition software and may include unintentional dictation errors.   Pia Mau [place], PA-C 12/08/22 1828    Georga Hacking, MD 12/08/22 [phone]

## 2022-12-08 NOTE — ED Triage Notes (Signed)
Laceration to right thumb while slicing bread. Bleeding controlled.tetanus not up to date.

## 2023-08-13 ENCOUNTER — Ambulatory Visit: Payer: Self-pay | Admitting: Certified Nurse Midwife
